# Patient Record
Sex: Female | Born: 1935 | Race: White | Hispanic: No | Smoking: Former smoker
Health system: Southern US, Community
[De-identification: ages and names within clinical notes are randomized; demographics above are authoritative.]

## PROBLEM LIST (undated history)

## (undated) DIAGNOSIS — I2510 Atherosclerotic heart disease of native coronary artery without angina pectoris: Secondary | ICD-10-CM

## (undated) DIAGNOSIS — I10 Essential (primary) hypertension: Secondary | ICD-10-CM

## (undated) DIAGNOSIS — J45909 Unspecified asthma, uncomplicated: Secondary | ICD-10-CM

## (undated) HISTORY — PX: OTHER SURGICAL HISTORY: SHX169

## (undated) HISTORY — PX: PACEMAKER INSERTION: SHX728

## (undated) HISTORY — PX: CORONARY ANGIOPLASTY WITH STENT PLACEMENT: SHX49

---

## 2003-09-23 ENCOUNTER — Encounter: Admission: RE | Admit: 2003-09-23 | Discharge: 2003-09-23 | Payer: Self-pay | Admitting: Orthopedic Surgery

## 2004-04-23 ENCOUNTER — Ambulatory Visit: Payer: Self-pay | Admitting: Ophthalmology

## 2004-04-27 ENCOUNTER — Ambulatory Visit: Payer: Self-pay | Admitting: Ophthalmology

## 2004-06-08 ENCOUNTER — Ambulatory Visit: Payer: Self-pay | Admitting: Ophthalmology

## 2004-06-15 ENCOUNTER — Ambulatory Visit: Payer: Self-pay | Admitting: Ophthalmology

## 2004-07-15 ENCOUNTER — Ambulatory Visit: Payer: Self-pay | Admitting: Gastroenterology

## 2005-07-04 ENCOUNTER — Ambulatory Visit: Payer: Self-pay | Admitting: Family Medicine

## 2005-08-10 ENCOUNTER — Ambulatory Visit: Payer: Self-pay | Admitting: Family Medicine

## 2006-07-05 ENCOUNTER — Ambulatory Visit: Payer: Self-pay | Admitting: Family Medicine

## 2006-12-18 ENCOUNTER — Ambulatory Visit: Payer: Self-pay | Admitting: Gastroenterology

## 2006-12-29 ENCOUNTER — Ambulatory Visit: Payer: Self-pay | Admitting: Gastroenterology

## 2007-07-16 ENCOUNTER — Ambulatory Visit: Payer: Self-pay | Admitting: Family Medicine

## 2007-09-19 ENCOUNTER — Other Ambulatory Visit: Payer: Self-pay

## 2007-09-19 ENCOUNTER — Emergency Department: Payer: Self-pay | Admitting: Emergency Medicine

## 2008-08-25 ENCOUNTER — Ambulatory Visit: Payer: Self-pay | Admitting: Urology

## 2008-12-15 ENCOUNTER — Ambulatory Visit: Payer: Self-pay | Admitting: General Practice

## 2008-12-24 ENCOUNTER — Ambulatory Visit: Payer: Self-pay | Admitting: General Practice

## 2008-12-31 ENCOUNTER — Inpatient Hospital Stay: Payer: Self-pay | Admitting: General Practice

## 2009-06-28 ENCOUNTER — Emergency Department: Payer: Self-pay | Admitting: Emergency Medicine

## 2009-11-26 ENCOUNTER — Ambulatory Visit: Payer: Self-pay | Admitting: Vascular Surgery

## 2010-11-01 ENCOUNTER — Ambulatory Visit: Payer: Self-pay | Admitting: Vascular Surgery

## 2011-03-23 ENCOUNTER — Ambulatory Visit: Payer: Self-pay | Admitting: Vascular Surgery

## 2011-04-12 ENCOUNTER — Ambulatory Visit: Payer: Self-pay | Admitting: General Practice

## 2011-04-25 ENCOUNTER — Inpatient Hospital Stay: Payer: Self-pay | Admitting: General Practice

## 2011-04-30 ENCOUNTER — Encounter: Payer: Self-pay | Admitting: Internal Medicine

## 2011-05-06 ENCOUNTER — Ambulatory Visit: Payer: Self-pay | Admitting: Internal Medicine

## 2011-05-19 ENCOUNTER — Encounter: Payer: Self-pay | Admitting: Internal Medicine

## 2011-06-18 ENCOUNTER — Encounter: Payer: Self-pay | Admitting: Internal Medicine

## 2011-07-19 LAB — CK TOTAL AND CKMB (NOT AT ARMC)
CK, Total: 149 U/L (ref 21–215)
CK-MB: 4.8 ng/mL — ABNORMAL HIGH (ref 0.5–3.6)

## 2011-07-19 LAB — COMPREHENSIVE METABOLIC PANEL
Alkaline Phosphatase: 108 U/L (ref 50–136)
BUN: 31 mg/dL — ABNORMAL HIGH (ref 7–18)
Co2: 23 mmol/L (ref 21–32)
Creatinine: 1.25 mg/dL (ref 0.60–1.30)
EGFR (African American): 53 — ABNORMAL LOW
EGFR (Non-African Amer.): 44 — ABNORMAL LOW
SGOT(AST): 34 U/L (ref 15–37)
SGPT (ALT): 24 U/L
Sodium: 137 mmol/L (ref 136–145)

## 2011-07-19 LAB — CBC
HCT: 38.8 % (ref 35.0–47.0)
MCHC: 33.3 g/dL (ref 32.0–36.0)
MCV: 89 fL (ref 80–100)
RDW: 14.9 % — ABNORMAL HIGH (ref 11.5–14.5)

## 2011-07-20 ENCOUNTER — Inpatient Hospital Stay: Payer: Self-pay | Admitting: Internal Medicine

## 2011-07-20 LAB — CK TOTAL AND CKMB (NOT AT ARMC)
CK, Total: 141 U/L (ref 21–215)
CK-MB: 4.4 ng/mL — ABNORMAL HIGH (ref 0.5–3.6)

## 2011-07-22 ENCOUNTER — Encounter: Payer: Self-pay | Admitting: Internal Medicine

## 2011-08-19 ENCOUNTER — Encounter: Payer: Self-pay | Admitting: Internal Medicine

## 2011-12-06 LAB — CBC
HCT: 35 % (ref 35.0–47.0)
MCHC: 33.5 g/dL (ref 32.0–36.0)
MCV: 93 fL (ref 80–100)
RBC: 3.75 10*6/uL — ABNORMAL LOW (ref 3.80–5.20)

## 2011-12-06 LAB — COMPREHENSIVE METABOLIC PANEL
Anion Gap: 9 (ref 7–16)
Bilirubin,Total: 0.2 mg/dL (ref 0.2–1.0)
Calcium, Total: 8.6 mg/dL (ref 8.5–10.1)
Co2: 24 mmol/L (ref 21–32)
Creatinine: 1.93 mg/dL — ABNORMAL HIGH (ref 0.60–1.30)
Glucose: 119 mg/dL — ABNORMAL HIGH (ref 65–99)
Potassium: 4.8 mmol/L (ref 3.5–5.1)
SGPT (ALT): 25 U/L

## 2011-12-07 ENCOUNTER — Inpatient Hospital Stay: Payer: Self-pay | Admitting: Specialist

## 2011-12-07 LAB — URINALYSIS, COMPLETE
Bacteria: NONE SEEN
Glucose,UR: NEGATIVE mg/dL (ref 0–75)
Ketone: NEGATIVE
Leukocyte Esterase: NEGATIVE
Nitrite: NEGATIVE
Ph: 5 (ref 4.5–8.0)
Protein: 30
RBC,UR: <1 /HPF (ref 0–5)
Squamous Epithelial: <1

## 2011-12-07 LAB — CK TOTAL AND CKMB (NOT AT ARMC)
CK, Total: 98 U/L (ref 21–215)
CK-MB: 3 ng/mL (ref 0.5–3.6)

## 2011-12-07 LAB — TROPONIN I: Troponin-I: 0.29 ng/mL — ABNORMAL HIGH

## 2011-12-08 LAB — BASIC METABOLIC PANEL
Anion Gap: 13 (ref 7–16)
BUN: 28 mg/dL — ABNORMAL HIGH (ref 7–18)
Calcium, Total: 9.2 mg/dL (ref 8.5–10.1)
Creatinine: 1.66 mg/dL — ABNORMAL HIGH (ref 0.60–1.30)
EGFR (Non-African Amer.): 28 — ABNORMAL LOW
Glucose: 163 mg/dL — ABNORMAL HIGH (ref 65–99)
Potassium: 4.4 mmol/L (ref 3.5–5.1)
Sodium: 139 mmol/L (ref 136–145)

## 2011-12-10 LAB — BASIC METABOLIC PANEL
Anion Gap: 11 (ref 7–16)
Chloride: 99 mmol/L (ref 98–107)
Co2: 26 mmol/L (ref 21–32)
Creatinine: 2.16 mg/dL — ABNORMAL HIGH (ref 0.60–1.30)
EGFR (Non-African Amer.): 20 — ABNORMAL LOW
Potassium: 4.3 mmol/L (ref 3.5–5.1)

## 2011-12-10 LAB — PLATELET COUNT: Platelet: 215 10*3/uL (ref 150–440)

## 2011-12-10 LAB — HEMOGLOBIN: HGB: 10 g/dL — ABNORMAL LOW (ref 12.0–16.0)

## 2011-12-11 LAB — BASIC METABOLIC PANEL
Anion Gap: 11 (ref 7–16)
BUN: 41 mg/dL — ABNORMAL HIGH (ref 7–18)
Calcium, Total: 8.4 mg/dL — ABNORMAL LOW (ref 8.5–10.1)
Creatinine: 1.68 mg/dL — ABNORMAL HIGH (ref 0.60–1.30)
EGFR (African American): 32 — ABNORMAL LOW
Glucose: 112 mg/dL — ABNORMAL HIGH (ref 65–99)
Osmolality: 283 (ref 275–301)

## 2012-03-22 ENCOUNTER — Ambulatory Visit: Payer: Self-pay | Admitting: Vascular Surgery

## 2012-03-22 LAB — CREATININE, SERUM: EGFR (Non-African Amer.): 25 — ABNORMAL LOW

## 2012-03-22 LAB — BUN: BUN: 35 mg/dL — ABNORMAL HIGH (ref 7–18)

## 2012-05-26 ENCOUNTER — Inpatient Hospital Stay: Payer: Self-pay | Admitting: Student

## 2012-05-26 LAB — TROPONIN I
Troponin-I: 0.13 ng/mL — ABNORMAL HIGH
Troponin-I: 0.42 ng/mL — ABNORMAL HIGH

## 2012-05-26 LAB — COMPREHENSIVE METABOLIC PANEL
Anion Gap: 11 (ref 7–16)
Calcium, Total: 9.3 mg/dL (ref 8.5–10.1)
Chloride: 106 mmol/L (ref 98–107)
Co2: 20 mmol/L — ABNORMAL LOW (ref 21–32)
EGFR (African American): 50 — ABNORMAL LOW
EGFR (Non-African Amer.): 43 — ABNORMAL LOW
SGOT(AST): 40 U/L — ABNORMAL HIGH (ref 15–37)
SGPT (ALT): 24 U/L (ref 12–78)

## 2012-05-26 LAB — URINALYSIS, COMPLETE
Bacteria: NONE SEEN
Glucose,UR: NEGATIVE mg/dL (ref 0–75)
Leukocyte Esterase: NEGATIVE
Nitrite: NEGATIVE
Protein: 100
WBC UR: 3 /HPF (ref 0–5)

## 2012-05-26 LAB — CBC
HGB: 12.6 g/dL (ref 12.0–16.0)
Platelet: 236 10*3/uL (ref 150–440)
RBC: 4.08 10*6/uL (ref 3.80–5.20)
RDW: 13.2 % (ref 11.5–14.5)

## 2012-05-26 LAB — BASIC METABOLIC PANEL
Anion Gap: 4 — ABNORMAL LOW (ref 7–16)
Calcium, Total: 9.2 mg/dL (ref 8.5–10.1)
Chloride: 108 mmol/L — ABNORMAL HIGH (ref 98–107)
Co2: 27 mmol/L (ref 21–32)
Creatinine: 1.31 mg/dL — ABNORMAL HIGH (ref 0.60–1.30)
EGFR (African American): 43 — ABNORMAL LOW

## 2012-05-26 LAB — CK TOTAL AND CKMB (NOT AT ARMC): CK-MB: 3.6 ng/mL (ref 0.5–3.6)

## 2012-05-27 LAB — BASIC METABOLIC PANEL
Anion Gap: 8 (ref 7–16)
Calcium, Total: 9.3 mg/dL (ref 8.5–10.1)
Co2: 25 mmol/L (ref 21–32)
Creatinine: 1.38 mg/dL — ABNORMAL HIGH (ref 0.60–1.30)
EGFR (African American): 41 — ABNORMAL LOW
EGFR (Non-African Amer.): 35 — ABNORMAL LOW
Glucose: 114 mg/dL — ABNORMAL HIGH (ref 65–99)
Sodium: 140 mmol/L (ref 136–145)

## 2012-05-27 LAB — LIPID PANEL: VLDL Cholesterol, Calc: 14 mg/dL (ref 5–40)

## 2012-05-27 LAB — TROPONIN I: Troponin-I: 0.74 ng/mL — ABNORMAL HIGH

## 2012-05-27 LAB — CBC WITH DIFFERENTIAL/PLATELET
Basophil #: 0.1 10*3/uL (ref 0.0–0.1)
Eosinophil #: 0.3 10*3/uL (ref 0.0–0.7)
HCT: 37.4 % (ref 35.0–47.0)
Lymphocyte %: 17.3 %
MCHC: 33.5 g/dL (ref 32.0–36.0)
Neutrophil #: 7 10*3/uL — ABNORMAL HIGH (ref 1.4–6.5)
RDW: 12.9 % (ref 11.5–14.5)

## 2012-05-27 LAB — CK TOTAL AND CKMB (NOT AT ARMC): CK-MB: 5.1 ng/mL — ABNORMAL HIGH (ref 0.5–3.6)

## 2012-05-28 LAB — BASIC METABOLIC PANEL
BUN: 17 mg/dL (ref 7–18)
Chloride: 100 mmol/L (ref 98–107)
Osmolality: 274 (ref 275–301)
Potassium: 3.6 mmol/L (ref 3.5–5.1)

## 2012-05-28 LAB — CBC WITH DIFFERENTIAL/PLATELET
Basophil #: 0.1 10*3/uL (ref 0.0–0.1)
Eosinophil #: 0 10*3/uL (ref 0.0–0.7)
HGB: 12.7 g/dL (ref 12.0–16.0)
MCH: 31.2 pg (ref 26.0–34.0)
MCHC: 34.2 g/dL (ref 32.0–36.0)
Neutrophil #: 12 10*3/uL — ABNORMAL HIGH (ref 1.4–6.5)
Neutrophil %: 84.3 %

## 2012-05-29 LAB — CBC WITH DIFFERENTIAL/PLATELET
Basophil #: 0.1 10*3/uL (ref 0.0–0.1)
Eosinophil %: 1.9 %
HCT: 31.4 % — ABNORMAL LOW (ref 35.0–47.0)
HGB: 11 g/dL — ABNORMAL LOW (ref 12.0–16.0)
Lymphocyte %: 13.5 %
Monocyte %: 10.6 %
Neutrophil #: 7.4 10*3/uL — ABNORMAL HIGH (ref 1.4–6.5)
RBC: 3.41 10*6/uL — ABNORMAL LOW (ref 3.80–5.20)
WBC: 10 10*3/uL (ref 3.6–11.0)

## 2012-05-29 LAB — BASIC METABOLIC PANEL
Creatinine: 1.92 mg/dL — ABNORMAL HIGH (ref 0.60–1.30)
EGFR (African American): 27 — ABNORMAL LOW
EGFR (Non-African Amer.): 23 — ABNORMAL LOW
Glucose: 86 mg/dL (ref 65–99)
Potassium: 3.7 mmol/L (ref 3.5–5.1)
Sodium: 135 mmol/L — ABNORMAL LOW (ref 136–145)

## 2012-05-30 LAB — BASIC METABOLIC PANEL
Anion Gap: 6 — ABNORMAL LOW (ref 7–16)
BUN: 21 mg/dL — ABNORMAL HIGH (ref 7–18)
Creatinine: 1.3 mg/dL (ref 0.60–1.30)
EGFR (Non-African Amer.): 38 — ABNORMAL LOW

## 2012-05-30 LAB — PROTEIN / CREATININE RATIO, URINE
Protein, Random Urine: 68 mg/dL — ABNORMAL HIGH (ref 0–12)
Protein/Creat. Ratio: 1444 mg/gCREAT — ABNORMAL HIGH (ref 0–200)

## 2012-06-06 ENCOUNTER — Emergency Department: Payer: Self-pay | Admitting: Emergency Medicine

## 2012-06-06 LAB — COMPREHENSIVE METABOLIC PANEL
Albumin: 3.5 g/dL (ref 3.4–5.0)
Alkaline Phosphatase: 163 U/L — ABNORMAL HIGH (ref 50–136)
Calcium, Total: 9.3 mg/dL (ref 8.5–10.1)
Co2: 25 mmol/L (ref 21–32)
EGFR (Non-African Amer.): 48 — ABNORMAL LOW
Glucose: 115 mg/dL — ABNORMAL HIGH (ref 65–99)
SGOT(AST): 31 U/L (ref 15–37)

## 2012-06-06 LAB — URINALYSIS, COMPLETE
Bacteria: NONE SEEN
Bilirubin,UR: NEGATIVE
Glucose,UR: NEGATIVE mg/dL (ref 0–75)
Ketone: NEGATIVE
RBC,UR: <1 /HPF (ref 0–5)
WBC UR: 5 /HPF (ref 0–5)

## 2012-06-06 LAB — CBC
MCH: 31.1 pg (ref 26.0–34.0)
MCV: 92 fL (ref 80–100)
Platelet: 268 10*3/uL (ref 150–440)
RDW: 13 % (ref 11.5–14.5)

## 2012-06-06 LAB — TROPONIN I: Troponin-I: 0.33 ng/mL — ABNORMAL HIGH

## 2012-06-19 ENCOUNTER — Ambulatory Visit: Payer: Self-pay | Admitting: Internal Medicine

## 2012-07-04 ENCOUNTER — Emergency Department: Payer: Self-pay

## 2012-07-04 ENCOUNTER — Ambulatory Visit: Payer: Self-pay | Admitting: Family Medicine

## 2012-07-18 ENCOUNTER — Ambulatory Visit: Payer: Self-pay | Admitting: Internal Medicine

## 2012-08-05 ENCOUNTER — Inpatient Hospital Stay: Payer: Self-pay | Admitting: Internal Medicine

## 2012-08-05 LAB — CBC
HCT: 38.1 % (ref 35.0–47.0)
HGB: 12.5 g/dL (ref 12.0–16.0)
Platelet: 220 10*3/uL (ref 150–440)
RBC: 4.22 10*6/uL (ref 3.80–5.20)
RDW: 13.4 % (ref 11.5–14.5)

## 2012-08-05 LAB — COMPREHENSIVE METABOLIC PANEL
BUN: 18 mg/dL (ref 7–18)
Calcium, Total: 9.3 mg/dL (ref 8.5–10.1)
Co2: 20 mmol/L — ABNORMAL LOW (ref 21–32)
EGFR (African American): >60
EGFR (Non-African Amer.): >60
Glucose: 101 mg/dL — ABNORMAL HIGH (ref 65–99)
Osmolality: 278 (ref 275–301)
Potassium: 5.9 mmol/L — ABNORMAL HIGH (ref 3.5–5.1)
SGPT (ALT): 27 U/L (ref 12–78)
Total Protein: 7.9 g/dL (ref 6.4–8.2)

## 2012-08-05 LAB — URINALYSIS, COMPLETE
Bacteria: NONE SEEN
Ketone: NEGATIVE
Leukocyte Esterase: NEGATIVE
Nitrite: NEGATIVE
Ph: 6 (ref 4.5–8.0)
WBC UR: 3 /HPF (ref 0–5)

## 2012-08-06 LAB — BASIC METABOLIC PANEL
Anion Gap: 9 (ref 7–16)
Calcium, Total: 9.3 mg/dL (ref 8.5–10.1)
Co2: 28 mmol/L (ref 21–32)
Creatinine: 1.3 mg/dL (ref 0.60–1.30)
EGFR (African American): 43 — ABNORMAL LOW
Potassium: 3.7 mmol/L (ref 3.5–5.1)
Sodium: 141 mmol/L (ref 136–145)

## 2012-08-06 LAB — TROPONIN I: Troponin-I: 0.23 ng/mL — ABNORMAL HIGH

## 2012-09-03 ENCOUNTER — Emergency Department: Payer: Self-pay | Admitting: Unknown Physician Specialty

## 2012-10-08 ENCOUNTER — Ambulatory Visit: Payer: Self-pay | Admitting: Cardiology

## 2012-10-08 LAB — CBC WITH DIFFERENTIAL/PLATELET
Basophil #: 0.1 10*3/uL (ref 0.0–0.1)
Basophil %: 1.2 %
Eosinophil %: 1.4 %
HCT: 32.4 % — ABNORMAL LOW (ref 35.0–47.0)
HGB: 10.7 g/dL — ABNORMAL LOW (ref 12.0–16.0)
Lymphocyte %: 13.3 %
MCH: 29 pg (ref 26.0–34.0)
MCV: 88 fL (ref 80–100)
Monocyte #: 0.9 x10 3/mm (ref 0.2–0.9)
Monocyte %: 10.4 %
Neutrophil %: 73.7 %
Platelet: 318 10*3/uL (ref 150–440)
RDW: 15.1 % — ABNORMAL HIGH (ref 11.5–14.5)
WBC: 8.2 10*3/uL (ref 3.6–11.0)

## 2012-10-08 LAB — BASIC METABOLIC PANEL
Anion Gap: 3 — ABNORMAL LOW (ref 7–16)
BUN: 24 mg/dL — ABNORMAL HIGH (ref 7–18)
Calcium, Total: 8.4 mg/dL — ABNORMAL LOW (ref 8.5–10.1)
Co2: 28 mmol/L (ref 21–32)
Creatinine: 1.44 mg/dL — ABNORMAL HIGH (ref 0.60–1.30)
EGFR (African American): 38 — ABNORMAL LOW
EGFR (Non-African Amer.): 33 — ABNORMAL LOW
Osmolality: 280 (ref 275–301)
Potassium: 4.3 mmol/L (ref 3.5–5.1)

## 2012-10-10 ENCOUNTER — Ambulatory Visit: Payer: Self-pay | Admitting: Cardiology

## 2012-12-17 ENCOUNTER — Ambulatory Visit: Payer: Self-pay | Admitting: Internal Medicine

## 2013-07-05 ENCOUNTER — Ambulatory Visit: Payer: Self-pay | Admitting: Family Medicine

## 2013-11-30 IMAGING — CT CT HEAD WITHOUT CONTRAST
2 series · 16 of 30 positions shown, 20 images · non-contrast
Comparison: none

REASON FOR EXAM: ha htn
COMMENTS:

PROCEDURE:     CT  - CT HEAD WITHOUT CONTRAST  - May 26, 2012 [DATE]
RESULT:     Comparison:  None
TECHNIQUE: Multiple axial images from the foramen magnum to the vertex were
obtained without IV contrast.

[Series 2: without · axial · non-contrast · 0.42mm/px · z∈[-169,-49]mm · 13 of 30 slices shown, 17 images]
[im 3/30  brain]
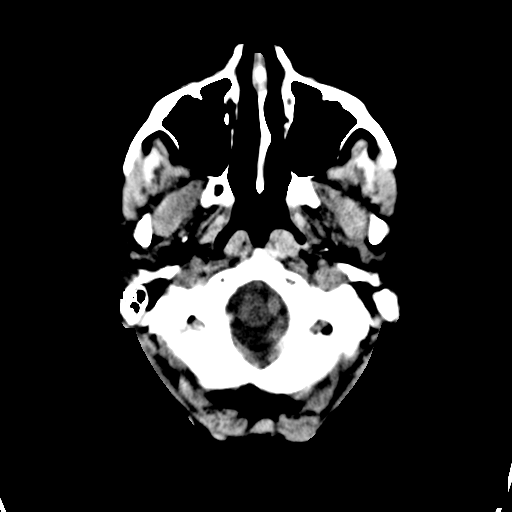
[im 3/30  bone]
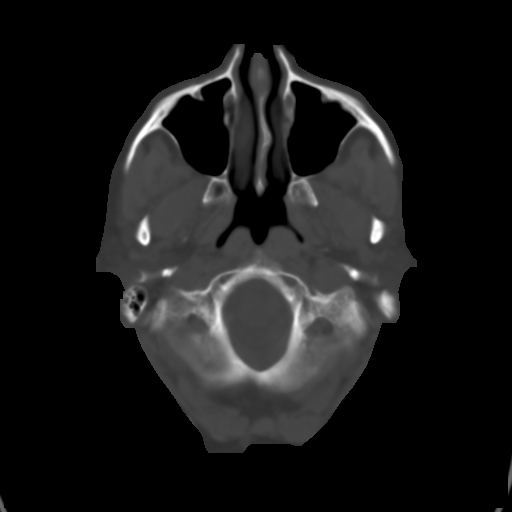
[im 5/30  brain]
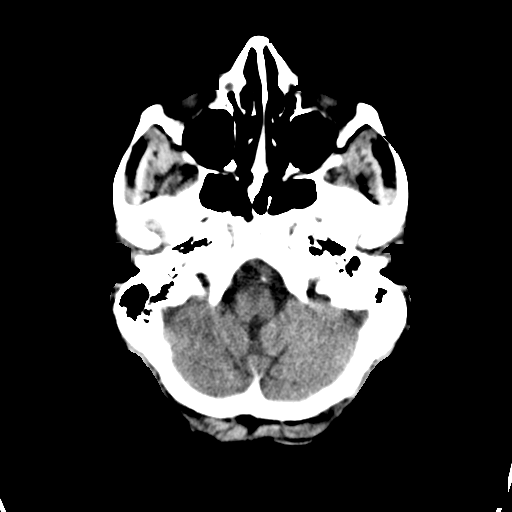
[im 7/30  brain]
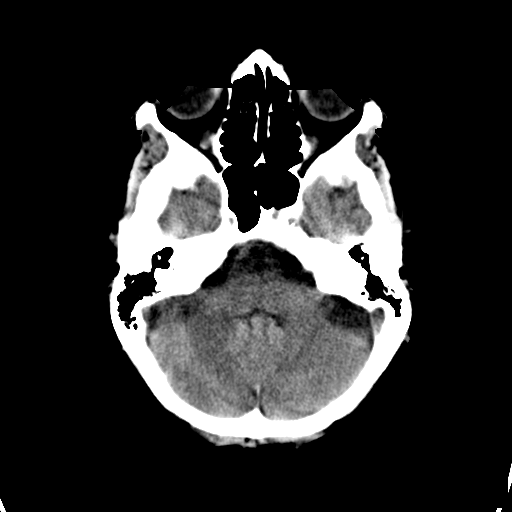
[im 9/30  brain]
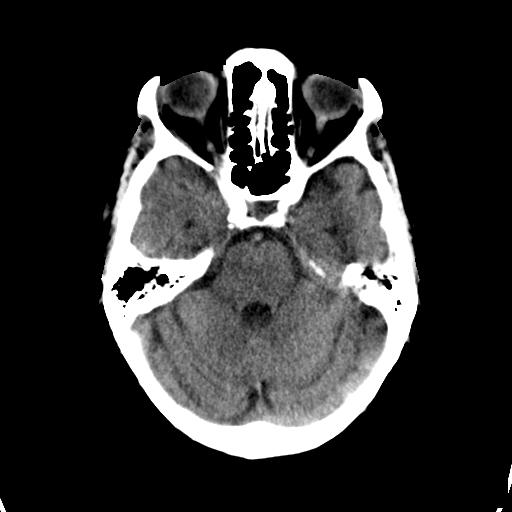
[im 11/30  brain]
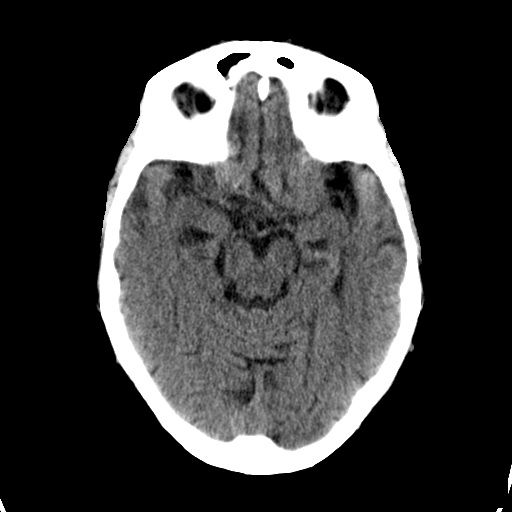
[im 11/30  bone]
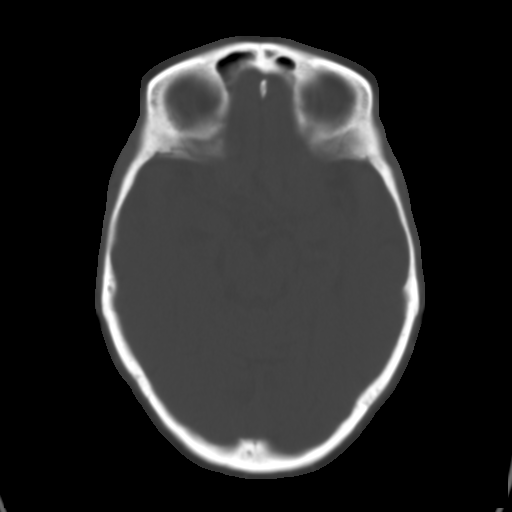
[im 13/30  brain]
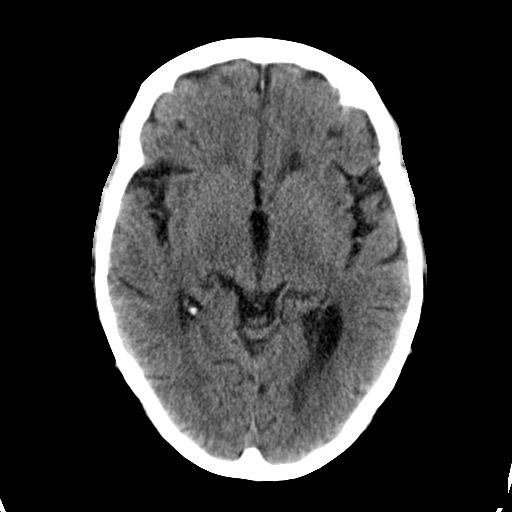
[im 15/30  brain]
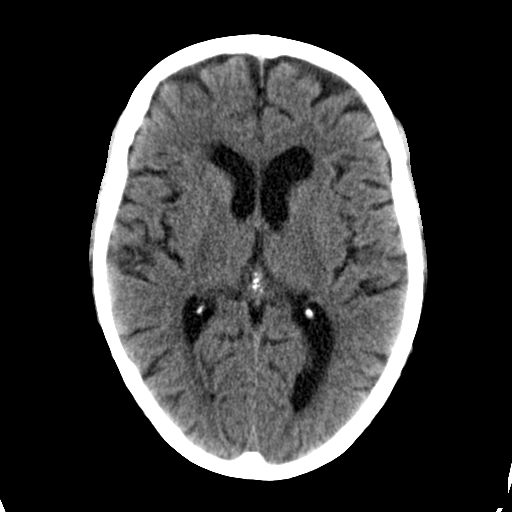
[im 17/30  brain]
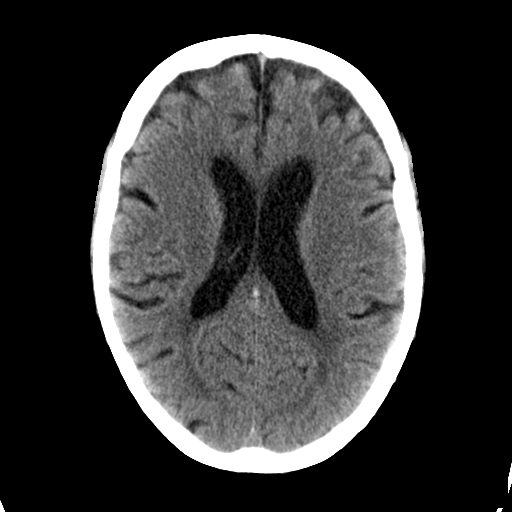
[im 19/30  brain]
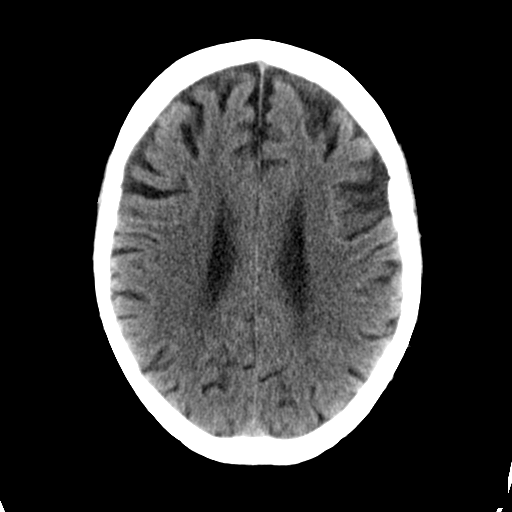
[im 19/30  bone]
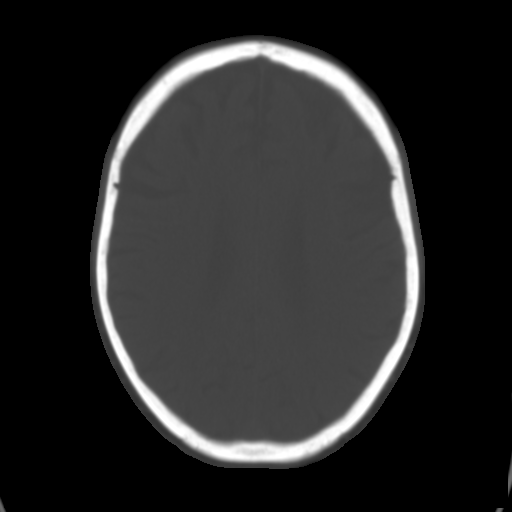
[im 21/30  brain]
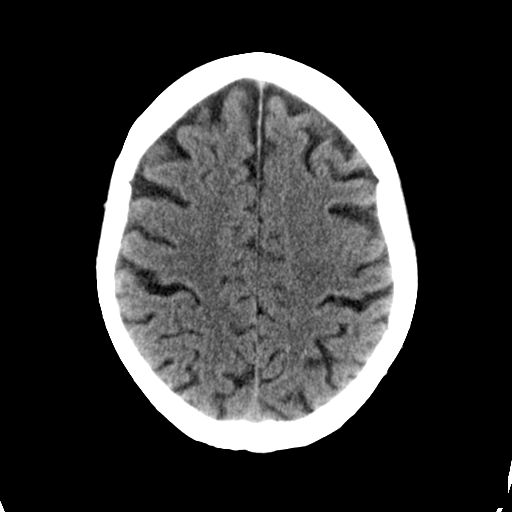
[im 23/30  brain]
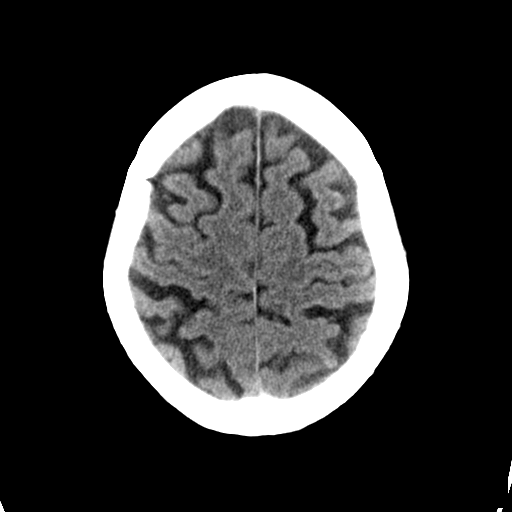
[im 25/30  brain]
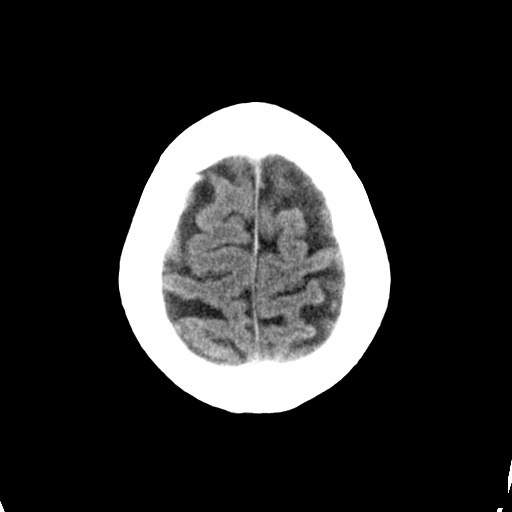
[im 27/30  brain]
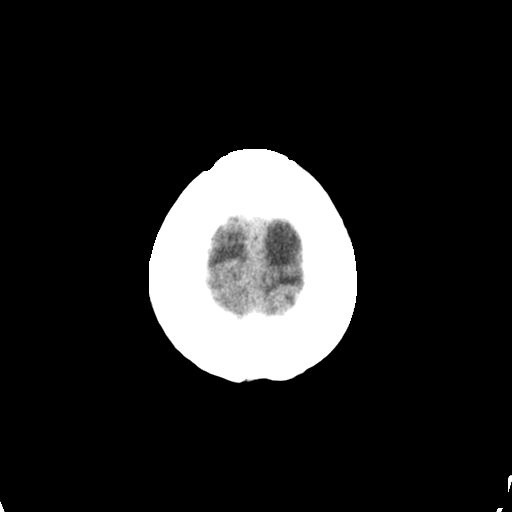
[im 27/30  bone]
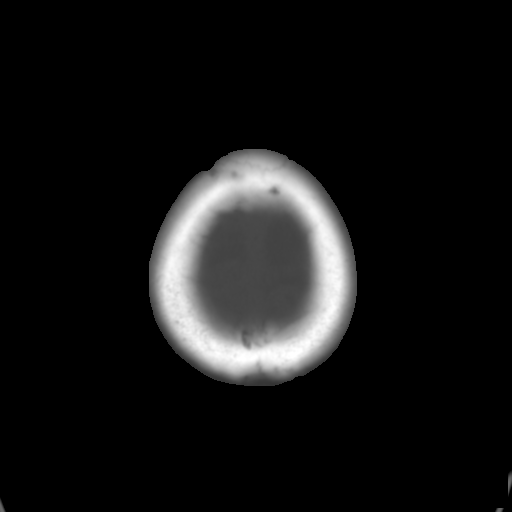

[Series 3: bone · axial · 0.42mm/px · z∈[-169,-129]mm · 3 of 30 slices shown]
[im 3/30  bone]
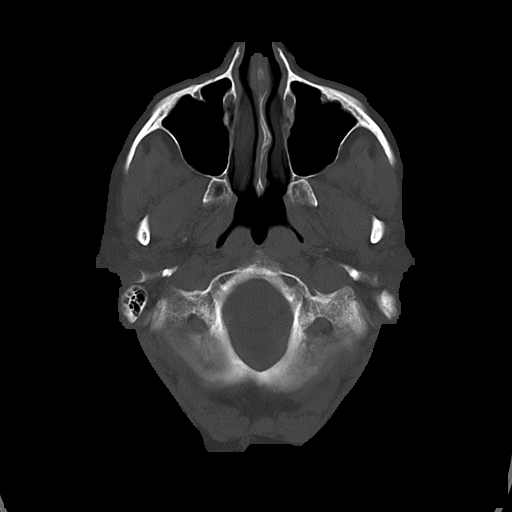
[im 7/30  bone]
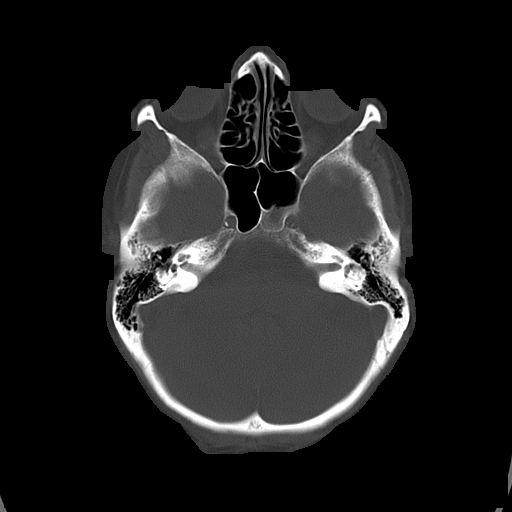
[im 11/30  bone]
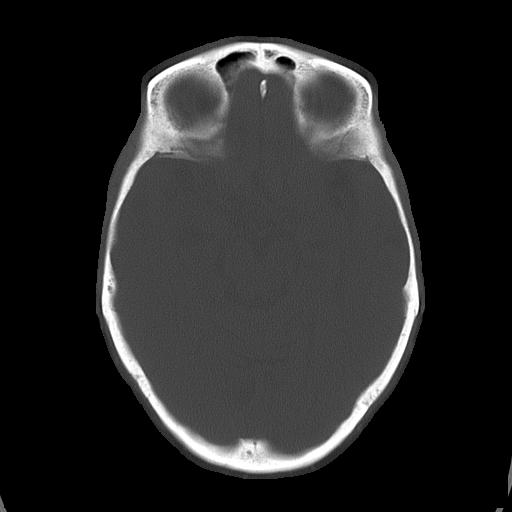

[16 of 30 positions shown; findings below may reference images not displayed]

FINDINGS: There is no evidence of mass effect, midline shift, or extra-axial fluid
collections.  There is no evidence of a space-occupying lesion or
intracranial hemorrhage. There is no evidence of a cortical-based area of
acute infarction.

The ventricles and sulci are appropriate for the patient's age. The basal
cisterns are patent.

Visualized portions of the orbits are unremarkable. The visualized portions
of the paranasal sinuses and mastoid air cells are unremarkable.

The osseous structures are unremarkable.
IMPRESSION: No acute intracranial process.

[REDACTED]

## 2014-02-10 ENCOUNTER — Ambulatory Visit: Payer: Self-pay | Admitting: Gastroenterology

## 2014-02-12 LAB — PATHOLOGY REPORT

## 2014-02-26 LAB — URINALYSIS, COMPLETE
BILIRUBIN, UR: NEGATIVE
Blood: NEGATIVE
Glucose,UR: NEGATIVE mg/dL (ref 0–75)
Ketone: NEGATIVE
NITRITE: NEGATIVE
PH: 6 (ref 4.5–8.0)
Protein: 30
RBC,UR: 41 /HPF (ref 0–5)
Specific Gravity: 1.012 (ref 1.003–1.030)
WBC UR: 1000 /HPF (ref 0–5)

## 2014-02-26 LAB — COMPREHENSIVE METABOLIC PANEL
ALK PHOS: 161 U/L — AB
Albumin: 3.3 g/dL — ABNORMAL LOW (ref 3.4–5.0)
Anion Gap: 18 — ABNORMAL HIGH (ref 7–16)
BUN: 26 mg/dL — ABNORMAL HIGH (ref 7–18)
Bilirubin,Total: 0.8 mg/dL (ref 0.2–1.0)
CHLORIDE: 99 mmol/L (ref 98–107)
Calcium, Total: 9.2 mg/dL (ref 8.5–10.1)
Co2: 17 mmol/L — ABNORMAL LOW (ref 21–32)
Creatinine: 2.38 mg/dL — ABNORMAL HIGH (ref 0.60–1.30)
EGFR (African American): 21 — ABNORMAL LOW
EGFR (Non-African Amer.): 18 — ABNORMAL LOW
GLUCOSE: 149 mg/dL — AB (ref 65–99)
Osmolality: 276 (ref 275–301)
POTASSIUM: 3.7 mmol/L (ref 3.5–5.1)
SGOT(AST): 55 U/L — ABNORMAL HIGH (ref 15–37)
SGPT (ALT): 32 U/L
SODIUM: 134 mmol/L — AB (ref 136–145)
Total Protein: 8.1 g/dL (ref 6.4–8.2)

## 2014-02-26 LAB — CBC
HCT: 48.5 % — ABNORMAL HIGH (ref 35.0–47.0)
HGB: 15.7 g/dL (ref 12.0–16.0)
MCH: 29.7 pg (ref 26.0–34.0)
MCHC: 32.4 g/dL (ref 32.0–36.0)
MCV: 92 fL (ref 80–100)
Platelet: 277 10*3/uL (ref 150–440)
RBC: 5.29 10*6/uL — ABNORMAL HIGH (ref 3.80–5.20)
RDW: 14.1 % (ref 11.5–14.5)
WBC: 39.6 10*3/uL — ABNORMAL HIGH (ref 3.6–11.0)

## 2014-02-26 LAB — TROPONIN I: Troponin-I: 0.07 ng/mL — ABNORMAL HIGH

## 2014-02-26 LAB — LIPASE, BLOOD: Lipase: 125 U/L (ref 73–393)

## 2014-02-27 ENCOUNTER — Inpatient Hospital Stay: Payer: Self-pay | Admitting: Internal Medicine

## 2014-02-27 LAB — BASIC METABOLIC PANEL
Anion Gap: 13 (ref 7–16)
BUN: 28 mg/dL — ABNORMAL HIGH (ref 7–18)
Calcium, Total: 8.3 mg/dL — ABNORMAL LOW (ref 8.5–10.1)
Chloride: 100 mmol/L (ref 98–107)
Co2: 22 mmol/L (ref 21–32)
Creatinine: 2.27 mg/dL — ABNORMAL HIGH (ref 0.60–1.30)
EGFR (African American): 22 — ABNORMAL LOW
EGFR (Non-African Amer.): 19 — ABNORMAL LOW
Glucose: 139 mg/dL — ABNORMAL HIGH (ref 65–99)
Osmolality: 278 (ref 275–301)
Potassium: 3.9 mmol/L (ref 3.5–5.1)
Sodium: 135 mmol/L — ABNORMAL LOW (ref 136–145)

## 2014-02-27 LAB — TSH: Thyroid Stimulating Horm: 1.23 u[IU]/mL

## 2014-02-27 LAB — TROPONIN I: TROPONIN-I: 0.06 ng/mL — AB

## 2014-02-28 LAB — URINE CULTURE

## 2014-02-28 LAB — BASIC METABOLIC PANEL
Anion Gap: 10 (ref 7–16)
BUN: 19 mg/dL — AB (ref 7–18)
CALCIUM: 7.8 mg/dL — AB (ref 8.5–10.1)
CHLORIDE: 108 mmol/L — AB (ref 98–107)
Co2: 23 mmol/L (ref 21–32)
Creatinine: 1.57 mg/dL — ABNORMAL HIGH (ref 0.60–1.30)
EGFR (African American): 34 — ABNORMAL LOW
EGFR (Non-African Amer.): 30 — ABNORMAL LOW
Glucose: 95 mg/dL (ref 65–99)
Osmolality: 283 (ref 275–301)
Potassium: 4 mmol/L (ref 3.5–5.1)
Sodium: 141 mmol/L (ref 136–145)

## 2014-02-28 LAB — CBC WITH DIFFERENTIAL/PLATELET
BASOS PCT: 0.5 %
Basophil #: 0.1 10*3/uL (ref 0.0–0.1)
EOS ABS: 0.1 10*3/uL (ref 0.0–0.7)
Eosinophil %: 0.4 %
HCT: 33.7 % — AB (ref 35.0–47.0)
HGB: 11.2 g/dL — ABNORMAL LOW (ref 12.0–16.0)
Lymphocyte #: 1.3 10*3/uL (ref 1.0–3.6)
Lymphocyte %: 8.6 %
MCH: 30.1 pg (ref 26.0–34.0)
MCHC: 33.1 g/dL (ref 32.0–36.0)
MCV: 91 fL (ref 80–100)
Monocyte #: 1 x10 3/mm — ABNORMAL HIGH (ref 0.2–0.9)
Monocyte %: 7 %
Neutrophil #: 12.5 10*3/uL — ABNORMAL HIGH (ref 1.4–6.5)
Neutrophil %: 83.5 %
PLATELETS: 170 10*3/uL (ref 150–440)
RBC: 3.71 10*6/uL — ABNORMAL LOW (ref 3.80–5.20)
RDW: 14.2 % (ref 11.5–14.5)
WBC: 14.9 10*3/uL — ABNORMAL HIGH (ref 3.6–11.0)

## 2014-03-01 LAB — CBC WITH DIFFERENTIAL/PLATELET
Basophil #: 0.1 10*3/uL (ref 0.0–0.1)
Basophil %: 0.7 %
EOS ABS: 0.2 10*3/uL (ref 0.0–0.7)
Eosinophil %: 2.3 %
HCT: 35.4 % (ref 35.0–47.0)
HGB: 11.8 g/dL — AB (ref 12.0–16.0)
LYMPHS ABS: 1.3 10*3/uL (ref 1.0–3.6)
Lymphocyte %: 12.4 %
MCH: 30.8 pg (ref 26.0–34.0)
MCHC: 33.2 g/dL (ref 32.0–36.0)
MCV: 93 fL (ref 80–100)
Monocyte #: 0.9 x10 3/mm (ref 0.2–0.9)
Monocyte %: 8.7 %
Neutrophil #: 8.1 10*3/uL — ABNORMAL HIGH (ref 1.4–6.5)
Neutrophil %: 75.9 %
PLATELETS: 168 10*3/uL (ref 150–440)
RBC: 3.82 10*6/uL (ref 3.80–5.20)
RDW: 14.2 % (ref 11.5–14.5)
WBC: 10.7 10*3/uL (ref 3.6–11.0)

## 2014-03-01 LAB — BASIC METABOLIC PANEL
Anion Gap: 10 (ref 7–16)
BUN: 9 mg/dL (ref 7–18)
CHLORIDE: 110 mmol/L — AB (ref 98–107)
Calcium, Total: 7.9 mg/dL — ABNORMAL LOW (ref 8.5–10.1)
Co2: 22 mmol/L (ref 21–32)
Creatinine: 1.25 mg/dL (ref 0.60–1.30)
EGFR (African American): 45 — ABNORMAL LOW
GFR CALC NON AF AMER: 39 — AB
Glucose: 106 mg/dL — ABNORMAL HIGH (ref 65–99)
Osmolality: 282 (ref 275–301)
POTASSIUM: 4.1 mmol/L (ref 3.5–5.1)
SODIUM: 142 mmol/L (ref 136–145)

## 2014-03-01 LAB — HEPATIC FUNCTION PANEL A (ARMC)
ALK PHOS: 94 U/L
Albumin: 2.3 g/dL — ABNORMAL LOW (ref 3.4–5.0)
Bilirubin, Direct: 0.1 mg/dL (ref 0.00–0.20)
Bilirubin,Total: 0.4 mg/dL (ref 0.2–1.0)
SGOT(AST): 43 U/L — ABNORMAL HIGH (ref 15–37)
SGPT (ALT): 22 U/L
TOTAL PROTEIN: 6 g/dL — AB (ref 6.4–8.2)

## 2014-03-04 LAB — CULTURE, BLOOD (SINGLE)

## 2014-05-02 ENCOUNTER — Ambulatory Visit: Payer: Self-pay | Admitting: Gastroenterology

## 2014-09-09 ENCOUNTER — Ambulatory Visit: Payer: Self-pay | Admitting: Family Medicine

## 2014-09-12 ENCOUNTER — Emergency Department: Payer: Self-pay | Admitting: Emergency Medicine

## 2014-09-19 ENCOUNTER — Inpatient Hospital Stay: Payer: Self-pay | Admitting: Internal Medicine

## 2014-10-05 ENCOUNTER — Emergency Department: Payer: Self-pay | Admitting: Emergency Medicine

## 2014-10-30 ENCOUNTER — Emergency Department
Admit: 2014-10-30 | Disposition: A | Discharge status: Home / Self Care | Payer: Self-pay | Admitting: Emergency Medicine

## 2014-10-30 LAB — CBC WITH DIFFERENTIAL/PLATELET
Basophil #: 0.1 10*3/uL (ref 0.0–0.1)
Basophil %: 1 %
EOS PCT: 0.4 %
Eosinophil #: 0.1 10*3/uL (ref 0.0–0.7)
HCT: 37.2 % (ref 35.0–47.0)
HGB: 12.2 g/dL (ref 12.0–16.0)
LYMPHS ABS: 1.1 10*3/uL (ref 1.0–3.6)
Lymphocyte %: 8.2 %
MCH: 29.7 pg (ref 26.0–34.0)
MCHC: 32.8 g/dL (ref 32.0–36.0)
MCV: 91 fL (ref 80–100)
Monocyte #: 0.6 x10 3/mm (ref 0.2–0.9)
Monocyte %: 4.9 %
Neutrophil #: 11.1 10*3/uL — ABNORMAL HIGH (ref 1.4–6.5)
Neutrophil %: 85.5 %
Platelet: 311 10*3/uL (ref 150–440)
RBC: 4.1 10*6/uL (ref 3.80–5.20)
RDW: 14.5 % (ref 11.5–14.5)
WBC: 13 10*3/uL — ABNORMAL HIGH (ref 3.6–11.0)

## 2014-10-30 LAB — COMPREHENSIVE METABOLIC PANEL
ALT: 20 U/L
AST: 31 U/L
Albumin: 4.1 g/dL
Alkaline Phosphatase: 89 U/L
Anion Gap: 10 (ref 7–16)
BILIRUBIN TOTAL: 0.4 mg/dL
BUN: 28 mg/dL — AB
CHLORIDE: 100 mmol/L — AB
Calcium, Total: 9.1 mg/dL
Co2: 26 mmol/L
Creatinine: 1.24 mg/dL — ABNORMAL HIGH
EGFR (African American): 45 — ABNORMAL LOW
GFR CALC NON AF AMER: 39 — AB
GLUCOSE: 110 mg/dL — AB
Potassium: 3.8 mmol/L
Sodium: 136 mmol/L
TOTAL PROTEIN: 8.1 g/dL

## 2014-11-04 ENCOUNTER — Emergency Department
Admit: 2014-11-04 | Disposition: A | Discharge status: Home / Self Care | Payer: Self-pay | Admitting: Emergency Medicine

## 2014-11-04 LAB — BASIC METABOLIC PANEL
ANION GAP: 7 (ref 7–16)
BUN: 20 mg/dL
CALCIUM: 8.8 mg/dL — AB
CHLORIDE: 100 mmol/L — AB
Co2: 28 mmol/L
Creatinine: 1.09 mg/dL — ABNORMAL HIGH
EGFR (African American): 53 — ABNORMAL LOW
EGFR (Non-African Amer.): 46 — ABNORMAL LOW
GLUCOSE: 128 mg/dL — AB
Potassium: 3.8 mmol/L
SODIUM: 135 mmol/L

## 2014-11-04 LAB — CBC
HCT: 36.4 % (ref 35.0–47.0)
HGB: 11.8 g/dL — ABNORMAL LOW (ref 12.0–16.0)
MCH: 29.7 pg (ref 26.0–34.0)
MCHC: 32.5 g/dL (ref 32.0–36.0)
MCV: 92 fL (ref 80–100)
Platelet: 280 10*3/uL (ref 150–440)
RBC: 3.97 10*6/uL (ref 3.80–5.20)
RDW: 14.6 % — AB (ref 11.5–14.5)
WBC: 8.5 10*3/uL (ref 3.6–11.0)

## 2014-11-04 NOTE — Op Note (Signed)
PATIENT NAME:  Sophia Bradley, FLORESTAL MR#:  [id number] DATE OF BIRTH:  [birth year]  DATE OF PROCEDURE:  03/22/2012  PREOPERATIVE DIAGNOSES:  1. Renal artery stenosis status post right renal artery stent placement with noninvasive studies suggesting recurrent stenosis.  2. Severe poorly controlled hypertension.  3. Chronic kidney disease with creatinine of 1.8 and creatinine clearance less than 30.   POSTOPERATIVE DIAGNOSES:  1. Renal artery stenosis status post right renal artery stent placement with noninvasive studies suggesting recurrent stenosis.  2. Severe poorly controlled hypertension.  3. Chronic kidney disease with creatinine of 1.8 and creatinine clearance less than 30.   PROCEDURES: 1. Ultrasound guidance for vascular access, right femoral artery.  2. Catheter placement into right renal artery from right femoral approach.  3. Aortogram and selective right renal arteriogram.  4. Percutaneous transluminal angioplasty of right renal artery with 5 mm diameter angioplasty balloon.  5. StarClose closure device, right femoral artery.   SURGEON: Annice Needy, MD  ANESTHESIA: Local with moderate conscious sedation.   ESTIMATED BLOOD LOSS: 25 mL.   INDICATION FOR PROCEDURE: This is an 81 year old white female with known history of renal artery stenosis. She has had worsening hypertension and worsening renal function and her right kidney had decreased in size. Recent noninvasive studies showed markedly elevated velocities within and just distal to the right renal artery stent worrisome for recurrent stenosis. She is brought back to angiography for further evaluation and possible treatment in hopes of improving hours hypertension and stabilizing her kidney dysfunction.   DESCRIPTION OF PROCEDURE: Patient is brought to the vascular interventional radiology suite. Groins shaved and prepped, sterile surgical field was created. Due to poor anatomic landmarks and multiple previous accesses the right  femoral artery was accessed under direct ultrasound guidance without difficulty with a Seldinger needle. J-wire and 5 French sheath were placed. Pigtail catheter was placed into the aorta at approximately the L1 level above the previously placed renal stent and AP aortogram was performed. This showed minimal stenosis of the left renal artery in the 20% range. The right renal artery stent had a significant stenosis somewhere in the 80% range. There was significant intimal hyperplasia throughout the stent. Patient was systemically heparinized. I used a C2 catheter to cannulate the right renal artery and crossed the lesion without difficulty with a V18 wire. A 5 mm diameter angioplasty balloon was then inflated encompassing the lesion with a tight waste taken which resolved with angioplasty. I then replaced the C2 catheter and performed completion angiography through this diagnostic catheter. This showed markedly improved flow through the renal artery stent with a good nephrogram and minimal residual stenosis of less than 20%. At this point, I elected to terminate the procedure. The sheath was removed. StarClose closure device deployed in the usual fashion with excellent hemostatic result. The patient tolerated procedure well and was taken to the recovery room in stable condition.    ____________________________ Annice Needy, MD jsd:cms D: 03/22/2012 18:11:26 ET T: 03/23/2012 09:48:33 ET JOB#: [id number]  cc: Annice Needy, MD, <Dictator> Rhona Leavens. Burnett Sheng, MD  Annice Needy MD ELECTRONICALLY SIGNED 03/25/2012 14:41

## 2014-11-04 NOTE — Consult Note (Signed)
General Aspect 81 yo female with history of hypertension and hyperlipidemia with history of renovascular disease s/p renal stent in 5/13. She was admitted after noting that her blood pressure was elevated when checking it as an outpatient. She denies any chest pain or shortness of breath. Her ekg is at its baseline. She was noted to have a serum troponin of 0.74. She states she is compliant with her meds. She also has CKD III with serum creatinine of 1.8. She takes clonidine 0.1-0.2 bid, lisinopril 20 mg daily, bystolic 20 mg daily. Since admission, she remains hypertensive and is some what anxious regarding this. EXR and head ct was normal   Physical Exam:   GEN well developed, well nourished, no acute distress    HEENT PERRL, hearing intact to voice    NECK supple    RESP normal resp effort  clear BS    CARD Regular rate and rhythm  No murmur    ABD denies tenderness  normal BS    LYMPH negative neck, negative axillae    EXTR negative cyanosis/clubbing, negative edema    SKIN normal to palpation    NEURO cranial nerves intact, motor/sensory function intact    PSYCH A+O to time, place, person   Review of Systems:   Subjective/Chief Complaint elevated blood pressure    General: No Complaints    Skin: No Complaints    ENT: No Complaints    Eyes: No Complaints    Neck: No Complaints    Respiratory: No Complaints    Cardiovascular: No Complaints    Gastrointestinal: Diarrhea    Genitourinary: No Complaints    Vascular: No Complaints    Musculoskeletal: No Complaints    Neurologic: No Complaints    Hematologic: No Complaints    Endocrine: No Complaints    Psychiatric: No Complaints    Review of Systems: All other systems were reviewed and found to be negative    Medications/Allergies Reviewed Medications/Allergies reviewed     Multi-drug Resistant Organism (MDRO): Positive culture for ESBL organsim., 06-May-2011   Clot in Left Leg:    GERD -  Esophageal Reflux:    Atrial Fibrillation:    Phlebitis of Left leg:    Hyperlipidemia:    PVD:    arthritis:    HTN:    Stent in Left Leg: 26-Nov-2009   D&C - Dilation and Curretage: 1961   Esophageal Dilation: 1995   Appendectomy:    Tonsillectomy:    right knee replacement:    left knee: 2006   bilateral cataracts: 2005   appendectomy: 1947   Carotid Endarterectomy Right Side:   Home Medications: Medication Instructions Status  furosemide tablet 20 mg 1 tab(s) orally once a day (in the morning) Active  PriLOSEC OTC 20 mg oral delayed release tablet 1 tab(s) orally once a day Active  trimethoprim 100 mg oral tablet 1 tab(s) orally once a day (at bedtime) Active  cyclobenzaprine 5 mg oral tablet 1 tab(s) orally once a day (at bedtime) Active  clonidine 0.2 mg oral tablet 2 tab(s) orally 2 times a day Active  citalopram 20 mg oral tablet 1 tab(s) orally once a day Active  Bystolic 10 mg oral tablet 1 tab(s) orally 2 times a day Active  acetaminophen 500 mg oral tablet 1 to 2 tab(s) orally every 4 hours as needed for pain or fever Active  aspirin 81 mg oral tablet 1 tab(s) orally once a day Active  lisinopril 40 mg oral tablet 1 tab(s)  orally once a day Active  Vitamin D2 50,000 intl units (1.25 mg) oral capsule 1 cap(s) orally once a week Active  pravastatin 20 mg oral tablet 1 tab(s) orally once a day (at bedtime) Active  Zyrtec 10 mg oral tablet 1  orally once a day, As Needed Active   EKG:   EKG NSR    Abnormal NSSTTW changes    Sulfa drugs: Rash    Impression 81 yo female with history of hypertension and peripheral vascular disease with stent in renal vessel now admitted with accelerated hypertension. No evidence of chf. Serum tropoinin is mild elevated with no chest pain or ekg changes. This is likely secondary to increased myocardial demand in face of hy8pertension and renal insuffiency. Does not appear to have had an acute ischemic cardiac event.  Would consider discontinuing iv heparin unless vascular surgery consultant feels this is needed. Would continue to treat hypertension with current drugs but will need to carefully follow renal function as creatinine is increased over baseline.    Plan 1. COnsider discontinuing heparin 2. Aggressivly treat hypertension. Will increase clonidine and follow pressure 3. Agree with eval per vascular surgery.  4. OK to transfer to telemetry.  5. Will follow with you.   Electronic Signatures: Dalia Heading (MD)  (Signed [phone] 08:52)  Authored: General Aspect/Present Illness, History and Physical Exam, Review of System, Past Medical History, Home Medications, EKG , Allergies, Impression/Plan   Last Updated: 10-Nov-13 08:52 by Dalia Heading (MD)

## 2014-11-04 NOTE — Op Note (Signed)
PATIENT NAME:  Sophia Bradley, Sophia Bradley MR#:  [id number] DATE OF BIRTH:  1925-12-05  DATE OF PROCEDURE:  05/28/2012  PREOPERATIVE DIAGNOSES:  1. Severe, poorly controlled hypertension.  2. Renal artery stenosis status post previous right renal artery stenting and angioplasty.  3. Atrial fibrillation.  4. Hyperlipidemia.  POSTOPERATIVE DIAGNOSES:  1. Severe, poorly controlled hypertension.  2. Renal artery stenosis status post previous right renal artery stenting and angioplasty.  3. Atrial fibrillation.  4. Hyperlipidemia.  PROCEDURE: 1. Ultrasound guidance for vascular access, right femoral artery.  2. Catheter placement into right renal artery from right femoral approach.  3. Aortogram and selective right renal angiogram.  4. Atrium covered stent placement, 6 mm diameter x 22 mm length into the right renal artery for recurrent stenosis.  5. StarClose closure device, right femoral artery.   SURGEON: Annice Needy, M.D.   ANESTHESIA: Local with moderate conscious sedation.   ESTIMATED BLOOD LOSS: 20-25 mL.   INDICATION FOR PROCEDURE: 81 year old white female who was readmitted with malignant hypertension, blood pressures well over 200 systolic. She has had right renal intervention previously. She is brought down for angiography for further evaluation and possible treatment. Risks and benefits were discussed. Informed consent was obtained.   DESCRIPTION OF PROCEDURE: The patient is brought to the vascular interventional radiology suite. Groins were shaved and prepped and a sterile surgical field was created. Due to multiple previous accesses, ultrasound was used to access the right femoral artery. This was done without difficulty with a Seldinger needle into a patent femoral artery and permanent image was recorded. J-wire and 5 French sheath were placed. Pigtail catheter was placed in the aorta just above the previously placed right renal stent at the L1 level and AP aortogram was performed. This  demonstrated what appeared to be diffuse intimal hyperplasia throughout the stent. There were not any high-grade stenoses, but the significant intimal hyperplasia did appear to be in the moderate range of restenosis. For this reason, I elected to treat it with a covered stent to try to reduce intimal hyperplasia formation in the future. I was able to use a C2 catheter and cross the lesion without difficulty with a Magic torque wire. I then exchanged for a 6 French Ansel sheath and brought this into the proximal portion of the stent. A 6-mm diameter x 22-mm length atrium covered stent was then selected. This encompassed the previously placed stent with intimal hyperplasia both proximally and distally and this was deployed reaching back just into the aorta. Completion angiogram following this demonstrated good flow through the atrium stent without significant flow limitation at this point and I elected to terminate the procedure. The sheath was pulled back and ipsilateral external artery oblique arteriogram was performed. A StarClose closure device was deployed in the usual fashion with excellent hemostatic result. The patient tolerated the procedure well and was taken to the recovery room in stable condition.   ____________________________ Annice Needy, MD jsd:bjt D: 05/28/2012 15:04:10 ET T: 05/28/2012 16:04:22 ET JOB#: [id number]  cc: Annice Needy, MD, <Dictator> Rhona Leavens. Burnett Sheng, MD Annice Needy MD ELECTRONICALLY SIGNED 06/04/2012 9:04

## 2014-11-04 NOTE — H&P (Signed)
PATIENT NAME:  Sophia Bradley, BEGNOCHE MR#:  [id number] DATE OF BIRTH:  01/06/1926  DATE OF ADMISSION:  05/27/2012  PRIMARY CARE PHYSICIAN: Jerl Mina, MD   VASCULAR SURGEON: Festus Barren, MD   CHIEF COMPLAINT: Uncontrolled blood pressure.   HISTORY OF PRESENT ILLNESS: Sophia Bradley is a very pleasant 81 year old Caucasian female with a past medical history of labile hypertension from renal artery stenosis who was seen by Dr. Wyn Quaker and had stent placement in the past followed by recurrent stenosis back in September 2013, peripheral vascular disease, hyperlipidemia, chronic urinary tract infection, and GERD who is presenting to the ER with a chief complaint of high blood pressure. The patient is reporting that following repair of the stenosed renal artery. Her blood pressure was reasonable and systolic blood pressure was running around 145 to 150. This afternoon when she checked her blood pressure it was really high and systolic was at 250 and diastolic was around 108. The patient started having headache but denies any chest pain. As the patient is concerned about her blood pressure, she came into the ER. She denies any blurry vision, black spots, or floaters. Denies any chest pain or shortness of breath. She has been following up with Dr. Wyn Quaker as an outpatient as well as Dr. Darrold Junker, cardiologist, regarding her labile hypertension. The patient's blood pressure in the ER initially was at 251/108. After giving clonidine it subsequently went down to 182/73 and during my examination blood pressure was at 167/74. CAT scan of the head was ordered which is pending. Chest x-ray has revealed no acute findings. The patient's creatinine is at 1.31 and, according to old medical records, her baseline creatinine is 1.44 which is her normal. Potassium is elevated at 5.7. One dose of Kayexalate was ordered by me in the ER. Cardiac enzymes were ordered. Initial troponin was 0.13 and subsequently in two hours it bumped up to 0.42.  The patient denies any chest pain or shortness of breath. Denies any past history of coronary artery disease. Denies any other complaints.   PAST MEDICAL HISTORY:  1. Labile hypertension from right renal artery stenosis status post stent placement with a noninvasive study suggesting recurrent stenosis in September 2013.  2. History of chronic renal insufficiency with baseline creatinine between 1.4 to 1.8.  3. Hyperlipidemia.  4. Peripheral vascular disease. 5. Arthritis. 6. Phlebitis of the left leg. 7. Gastroesophageal reflux disease.  PAST SURGICAL HISTORY:  1. Stent placement in the right renal artery for renal artery stenosis. 2. D and C in the past. 3. Esophageal dilatation in 1995. 4. Tonsillectomy. 5. Appendectomy. 6. Right knee replacement. 7. Left knee replacement in the year 2006. 8. Cataract repair.  9. Right carotid endarterectomy.   ALLERGIES: The patient is allergic to sulfa.   HOME MEDICATIONS:  1. Zyrtec 10 mg p.o. once daily. 2. Vitamin D 50,000 international units once a week. 3. Prilosec over-the-counter 20 mg p.o. once a day. 4. Trimethoprim 100 mg 1 tablet once a day. 5. Prevacid 20 mg p.o. once daily. 6. Lisinopril 40 mg once daily. 7. Furosemide 20 mg p.o. once a day. 8. Cyclobenzaprine 1 tablet p.o. once a day. 9. Clonidine 0.2 mg 2 tablets 2 times a day. 10. Celexa 1 tablet p.o. once daily. 11. Bystolic 10 mg 1 tablet p.o. b.i.d.  12. Aspirin 81 mg p.o. once daily. 13. Tylenol 500 mg 1 to 2 tablets p.o. q.4 hours as needed.   PSYCHOSOCIAL HISTORY: Lives at home with her daughter and son-in-law. Denies any  alcohol, smoking, or illicit drug usage.   FAMILY HISTORY: Both parents and siblings have chronic history of hypertension.   REVIEW OF SYSTEMS: CONSTITUTIONAL: Denies any fever, fatigue, weakness. EYES: Denies any double vision or blurry vision. ENT: Denies tinnitus, ear pain, hearing loss. RESPIRATORY: Denies cough, wheezing, hemoptysis.  CARDIOVASCULAR: The patient denies any chest pain or hypertension. GI: Denies nausea, vomiting, diarrhea, abdominal pain, or hematemesis. Positive GERD. GENITOURINARY: Denies any dysuria or hematuria. ENDOCRINE: No polyuria, nocturia, or polydipsia. Denies any thyroid problems. HEMATOLOGY/LYMPHATIC: Denies any anemia or easy bruising. No lymphadenopathy. SKIN: No acne, rash, or lesions. MUSCULOSKELETAL: Positive for arthritis. NEUROLOGIC: Complaining of headache but no history of TIA, CVA, numbness. PSYCHIATRIC: No anxiety, depression. All other systems are reviewed. Pertinent positives are as dictated above. All other systems are negative.   PHYSICAL EXAMINATION:  VITAL SIGNS: Temperature 97.5, pulse 55, respiratory rate 18, blood pressure during my examination 167/74, sating 97 to 100%.   GENERAL APPEARANCE: Not in acute distress. Answering questions appropriately. Well built and well nourished, not in acute distress.   HEENT: Normocephalic, atraumatic. Pupils are equally reacting to light and accommodation. Moist mucous membranes.   NECK: Supple. No JVD.   LUNGS: Clear to auscultation bilaterally. No wheezing. No crackles. No rhonchi.   CARDIOVASCULAR: S1, S2 normal. Regular rate and rhythm. PMI is intact.  GI: Soft. Bowel sounds are positive in four quadrants. Nontender, nondistended. No masses felt.   NEUROLOGIC: Awake, alert, and oriented x3. Cranial nerves II through XII are grossly intact.   EXTREMITIES: No edema. No cyanosis. Peripheral pulses are 2+.   SKIN: No lesions. No rashes. No lymphadenopathy.   LABS AND IMAGING STUDIES: CAT scan of the head is pending.   Chest x-ray no acute findings.   12-lead EKG no acute changes. Normal sinus rhythm.  Glucose 112, BUN 31, creatinine 1.31, initially it was at 1.17, sodium 139, potassium 5.7, chloride 108, CO2 27, GFR 43, anion gap 4, calcium 9.2, serum osmolality 285, alkaline phosphatase 151, AST 40, ALT 24, serum albumin 3.3. Total  CK 136. CK-MB 3.6. Initial troponin is 0.13, subsequently it bumped up to 0.42 in a period of two hours. WBC 12.1, hemoglobin 12.6, hematocrit 37.8, platelet count 236,000. Urinalysis nitrite negative, leukocyte esterase negative, straw color, glucose negative, bilirubin negative, ketones negative.   ASSESSMENT AND PLAN: This is an 81 year old Caucasian female presenting to the ER with elevated hypertension who will be admitted with the following assessment and plan.  1. Malignant hypertension probably from recurrent stenosis of right renal artery. Will admit her to Intensive Care Unit. Will continue her home medications, clonidine patch and metoprolol. Hydralazine 10 mg IV will be given on an as needed basis for systolic blood pressure greater than 170. Will consult Dr. Wyn Quaker, vascular surgeon, as there is a high probability that she might be having recurrent stenosis of right renal artery.  2. Positive troponin probably from malignant hypertension. Will cycle cardiac enzymes. Consult Cardiology Dr. Darrold Junker' group. ACS protocol will be implemented. Heparin drip will be started if CT head is negative.  3. Chronic renal insufficiency with a baseline creatinine of 1.8 as per previous medical records from September 2013. Will monitor renal function closely and avoid nephrotoxins.  4. Hyperkalemia. Will give her one dose of Kayexalate and repeat Chem-8 in a.m.  5. Will provide her GI prophylaxis.  6. DVT prophylaxis is not needed as the patient is on heparin drip.   CODE STATUS: She is FULL CODE.   The diagnosis  and plan of care was discussed in detail with the patient and her daughter who is bedside. They voiced understanding of the plan.   TOTAL CRITICAL CARE TIME SPENT: 60 minutes.    ____________________________ Ramonita Lab, MD ag:drc D: 05/26/2012 23:50:00 ET T: 05/27/2012 08:55:30 ET JOB#: [id number]  cc: Ramonita Lab, MD, <Dictator>, Rhona Leavens. Burnett Sheng, MD, Annice Needy, MD, Marcina Millard, MD   Ramonita Lab MD ELECTRONICALLY SIGNED 05/27/2012 22:46

## 2014-11-04 NOTE — Discharge Summary (Signed)
PATIENT NAME:  Sophia Bradley, Sophia Bradley MR#:  [id number] DATE OF BIRTH:  04/04/1926  DATE OF ADMISSION:  05/26/2012 DATE OF DISCHARGE:  05/31/2012  PRIMARY CARE PHYSICIAN: Dr. Burnett Sheng   CONSULTANTS:  1. Dr. Cherylann Ratel, nephrology  2. Dr. Lady Gary, cardiology. 3. Dr. Wyn Quaker, vascular surgery.   CHIEF COMPLAINT:  Elevated blood pressure.  DISCHARGE DIAGNOSES:  1. Malignant hypertension.  2. Renal artery stenosis status post stent.  3. Positive troponin, likely demand ischemia.  4. Possible subclavian artery stenosis.  5. Acute on chronic renal failure.  6. Hyperkalemia.  7. History of labile hypertension.  8. Status post stent placement with noninvasive study suggesting recurrent stenosis in 03/2012.  9. Hyperlipidemia.  10. Peripheral vascular disease.  11. Arthritis.  12. Phlebitis of the left leg.  13. Gastroesophageal reflux disease.   DISCHARGE MEDICATIONS:  1. Zyrtec 10 mg daily, one tab as needed.  2. Acetaminophen 500 mg, 1 to 2 tabs every four hours as needed for pain or fever.  3. Aspirin 81 mg daily.  4. Vitamin D2 50,000 international units once a week.  5. Pravastatin 20 mg once a day.  6. Furosemide 20 mg once a day in the morning.  7. Prilosec OTC 20 mg once a day.  8. Cyclobenzaprine 5 mg, 1 tab once a day.  9. Clonidine 0.2 mg, 2 tabs 2 times a day.  10. Citalopram 20 mg, 1 tab once a day.  11. Bystolic 10 mg 2 times a day.  12. Hydralazine 25 mg, 1 tab every eight hours. Stop trimethoprim and lisinopril.   DIET: Low sodium.   ACTIVITY: As tolerated.   DISCHARGE FOLLOWUP:  1. Please follow up with primary care physician and cardiologist within 1 to 2 weeks.  2. Please follow up with Dr. Festus Barren, vascular surgeon, within 2 to 4 weeks.  3. Check BMP within a week.   DISPOSITION: Home with Home Health.   HISTORY OF PRESENT ILLNESS: For full details of history and physical, please see the dictation on 05/27/2012 by Dr. Amado Coe. Briefly, this is an 81 year old female with  labile hypertension from renal artery stenosis who had a stent placed followed by restenosis back in September, peripheral vascular disease, and gastroesophageal reflux disease who came in with high blood pressure. On the day of admission her blood pressure was very uncontrolled and in the ER was 251/108. The patient also had a bump in her troponins had elevated potassium of 5.7 and was admitted to the hospitalist service. Dr. Wyn Quaker was consulted from vascular surgery.   SIGNIFICANT LABS AND IMAGING:  CT scan of the head without contrast: No acute intracranial process. Chest PA and lateral on arrival showing no acute disease of the chest. Ultrasound of the kidneys bilaterally showing mild cortical atrophy of both kidneys, greater on the right than the left. Also increased echotexture of renal cortex which may reflect an element of medical renal disease. No evidence of obstruction.   Initial creatinine 1.17, peak creatinine of 1.92 on 11/12. The last one is 1.3 on 11/13. Initial potassium was 5.4 and peak potassium 5.7, last potassium 3.7. Initial troponin 0.13, last troponin of 1.2. Initial WBC 12.1, last WBC of 10. Urine creatinine is 47.1, creatinine to protein ratio is 1444.  Random protein in the urine is 68. Urinalysis not suggestive of infection The patient also was found with small M spike.  HOSPITAL COURSE: The patient was admitted to the hospitalist service in the Critical Care Unit, started on p.o. as well as  IV blood pressure medications to bring down the blood pressure. The patient also was given Kayexalate for the hyperkalemia and the lisinopril and Lasix were held. The patient also had presented with headaches as well. She was started on nitroprusside drip. Blood pressure came down and she was taken to the OR on 11/11 for evaluation of the renal artery stenosis.  Because of restenosis which was seen during the procedure a stent was replaced. Blood pressure did come down afterwards. The patient did  have an increase in creatinine post surgery, which could have been because of hypertension, and was seen by nephrology. Further testing was done which shows renal ultrasound without any significant obstruction. She was also found with mild M spike on the SPEP sent by the nephrologist, and  she needs further evaluation by hematology for this. Per cardiology, the elevated troponin was likely demand ischemia. Her creatinine currently is back down. She was noted to have different blood pressure measurements on the right and left upper extremities, left being higher. I discussed the case with Dr. Wyn Quaker who believes the patient likely has some element of subclavian artery stenosis as well. However, he did not recommend any further investigation at this point and outpatient management and blood pressure control was recommended. The family has been aware of the different blood pressures in the upper extremities for multiple years and they state that Dr. Darrold Junker is also aware of this. I did discuss the possibility of having further investigation and possible stent need if she is symptomatic at a later date. At this point she will be discharged with outpatient followup.  TOTAL TIME SPENT: 40 minutes.    ____________________________ Krystal Eaton, MD sa:bjt D: 05/31/2012 13:17:03 ET T: 05/31/2012 14:24:53 ET JOB#: [id number]  cc: Krystal Eaton, MD, <Dictator> Rhona Leavens. Burnett Sheng, MD Krystal Eaton MD ELECTRONICALLY SIGNED 06/28/2012 11:02

## 2014-11-04 NOTE — Consult Note (Signed)
PATIENT NAME:  Sophia Bradley, Sophia Bradley MR#:  [id number] DATE OF BIRTH:  [birth year]  DATE OF CONSULTATION:  05/29/2012  REFERRING PHYSICIAN:  Dr. Mordecai Maes  CONSULTING PHYSICIAN:  Linsey Hirota Lizabeth Leyden, MD  REASON FOR CONSULTATION: Acute renal failure in the setting of chronic kidney disease stage III and history of known right renal artery stenosis status post recent intervention.   HISTORY OF PRESENT ILLNESS: The patient is a very pleasant 81 year old Caucasian female with past medical history of labile hypertension, recurrent right renal artery stenosis, chronic kidney disease stage III with fluctuating renal function with creatinine between 1.3 to 1.7, hyperlipidemia, peripheral vascular disease, osteoarthritis, phlebitis of the left lower extremity, gastroesophageal reflux disease, urinary incontinence with prolapsed bladder who presented to [facility] on 05/27/2012. Patient reports that she had quite elevated blood pressure at home with a systolic blood pressure as high as 250 and diastolic blood pressure greater than 108. The patient has known underlying right renal artery stenosis. She had recurrent right renal artery stenosis in September and had angioplasty then. She had original right renal artery stent placed on 12/09/2011. This admission the patient underwent right renal artery angioplasty once again. She was exposed to contrast dye and there was a possibility of distal embolization. In addition, the patient has been hypotensive over the past 24 hours. These likely are all contributing to patient's acute renal failure now. Patient was also on Lasix, however, this has been held temporarily. The patient has been started on 0.9 normal saline at 100 mL/h as well. Patient had some diminished appetite but denies nausea, vomiting, dysgeusia, or lower extremity edema at this time.   PAST MEDICAL HISTORY:  1. Labile hypertension.  2. Right renal artery stenosis with right renal artery stent  placement on 12/09/2011 with recurrent stenosis and PTA x2.  3. Chronic kidney disease, stage III, baseline creatinine fluctuating between 1.3 to 1.8.  4. Hyperlipidemia.  5. Peripheral vascular disease.  6. Osteoarthritis.  7. Gastroesophageal reflux disease.  8. Prolapsed bladder followed by [facility] in [place].    PAST SURGICAL HISTORY:  1. Stent placement in the right renal artery in 11/2011 with recurrent stenosis and PTA x2 thereafter.  2. Dilatation and curettage in the past.  3. Esophageal dilatation 1995.  4. Tonsillectomy.  5. Appendectomy.  6. Bilateral knee replacement.  7. Bilateral cataract removal.  8. Right carotid endarterectomy.   ALLERGIES: Sulfa.   CURRENT INPATIENT MEDICATIONS:  1. 0.9 normal saline at 100 mL/h.  2. Aspirin 325 mg daily.  3. Celexa 20 mg p.o. daily.  4. Clonidine 0.2 mg p.o. b.i.d.  5. Nitroglycerin 0.4 mg sublingual every five minutes p.r.n. chest pain.  6. Zofran 4 mg IV every six hours p.r.n.  7. Protonix 40 mg p.o. every 6:00 a.m.  8. Hydralazine 10 mg IV q.4 hours p.r.n. systolic blood pressure greater than 160.  9. Metoprolol 50 mg p.o. every 12 hours.  10. Pravachol 40 mg p.o. at bedtime.  11. Phenergan 25 mg IV q.6 hour p.r.n. nausea.  12. Flexeril 5 mg p.o. at bedtime.   SOCIAL HISTORY: Patient lives in [place] towards the [place]. She lives at home with her daughter and son-in-law. She quit smoking approximately 10 years ago. Drinks wine occasionally. Denies illicit drug use. Mother died of cerebrovascular accident. Father died of myocardial infarction. Patient had a sister who had end-stage renal disease.    REVIEW OF SYSTEMS: CONSTITUTIONAL: Denies fevers, chills, or weight loss. EYES: Had bilateral cataract removal. HENT:  Denies headaches, hearing loss. Denies epistaxis. CARDIOVASCULAR: Denies chest pain, palpitations, PND. RESPIRATORY: Denies cough, shortness of breath, hemoptysis. GASTROINTESTINAL: Denies  nausea, vomiting. Reports diminished appetite. GENITOURINARY: Denies frequency, urgency, dysuria. MUSCULOSKELETAL: Denies joint pain, swelling, or redness. INTEGUMENTARY: Denies skin rashes or lesions. NEUROLOGICAL: Denies focal extremity numbness, weakness. PSYCH: Has history of depression. ENDOCRINE: Denies polyuria, polydipsia, polyphagia. HEMATOLOGIC/LYMPHATIC: Denies easy bruisability, bleeding, or swollen lymph nodes. ALLERGY/IMMUNOLOGIC: Denies seasonal allergies or history of immunodeficiency.   PHYSICAL EXAMINATION:  VITAL SIGNS: Temperature 97.8, pulse 79, respirations 20, blood pressure 147/65, however, blood pressure has been as low as 87/46 this a.m.   GENERAL: Well-developed, well-nourished Caucasian female who appears her stated age, currently in no acute distress.   HEENT: Normocephalic, atraumatic. Extraocular movements are intact. Pupils were both dilated at about 8 mm and were slow to react. Patient has had bilateral cataract removal, however, no epistaxis noted. Gross hearing intact. Oral mucosa moist.   NECK: Supple without JVD or lymphadenopathy.   LUNGS: Clear to auscultation bilaterally with normal respiratory effort.   CARDIOVASCULAR: S1, S2 regular rate and rhythm. No murmurs, rubs, or gallops appreciated.   ABDOMEN: Soft, nontender, nondistended. Bowel sounds positive. No rebound or guarding. No gross organomegaly appreciated.   EXTREMITIES: No clubbing, cyanosis, or edema.   NEUROLOGIC: Patient is alert and oriented to time, person, and place. Strength is 5/5 in both upper and lower extremities.   SKIN: Warm and dry. No rashes noted. No obvious embolic phenomena noted in the hands or in the feet.   PSYCHIATRIC: Patient with appropriate affect and appears to have good insight into her current illness.   GENITOURINARY: No suprapubic tenderness noted at this time.   LABORATORY, DIAGNOSTIC AND RADIOLOGICAL DATA: Sodium 135, potassium 3.7, chloride 102, CO2 24, BUN  26, creatinine 1.92, glucose 86, total protein 8.0, albumin 3.3, total bilirubin 0.3, alkaline phosphatase 151, AST 40, ALT 24, troponin I 1.20 at its peak. CBC shows WBC 10, hemoglobin 11, hematocrit 31, platelets 205. Urinalysis shows urine protein of 100 mg/dL.   IMPRESSION: This is an 81 year old Caucasian female with past medical history of labile hypertension, right renal artery stenosis with original stent placement in 11/2011 with PTA x2 thereafter, chronic kidney disease stage III, baseline creatinine 1.4 to 1.8, hyperlipidemia, peripheral vascular disease, osteoarthritis, gastroesophageal reflux disease, depression, vitamin D deficiency who presented to [facility] with malignant hypertension:  1. Acute renal failure/chronic kidney disease, stage III. The patient has obvious acute renal failure at this time. The patient has acute renal failure from multiple reasons. She had recent contrast administration. She could potentially have had an embolic event to the right kidney as well. The patient was also hypotensive which likely altered renal hemodynamics. At this point in time there is no urgent indication for dialysis. However, she will need continued observation of her renal function. We will obtain a renal ultrasound to exclude obstruction. I agree with attempts at IV fluid hydration with 0.9 normal saline as she was hypotensive earlier. Would avoid further renal insult at this time and avoid any further contrast administration at this time. We will continue to monitor renal function daily.  2. Malignant hypertension. Blood pressure was quite high upon admission. However, now blood pressure has been low over the past 24 hours. I am hesitant to abruptly discontinue clonidine given the possibility for rebound hypertension. I agree with providing the patient with IV fluid hydration with 0.9 normal saline. Continue to follow blood pressure curve.  3. Proteinuria.  Urine protein was  noted as being 100 mg/dL. We will check SPEP, UPEP, and quantify proteinuria a bit further with urine protein to creatinine ratio.  4. I would like to thank Dr. Mordecai Maes for this kind referral. Further plan as patient progresses.   ____________________________ Lennox Pippins, MD mnl:cms D: 05/29/2012 13:23:36 ET T: 05/29/2012 13:57:40 ET JOB#: [id number]  cc: Lennox Pippins, MD, <Dictator> Lennox Pippins MD ELECTRONICALLY SIGNED 06/02/2012 13:34

## 2014-11-07 NOTE — Consult Note (Signed)
PATIENT NAME:  Sophia Bradley, Sophia Bradley MR#:  [id number] DATE OF BIRTH:  [birth year]  DATE OF CONSULTATION:  08/06/2012  REFERRING PHYSICIAN:  Marlaine Hind, MD   CONSULTING PHYSICIAN:  Marcina Millard, MD PRIMARY CARE PHYSICIAN: Rhona Leavens. Burnett Sheng, MD   CHIEF COMPLAINT: My blood pressure is up.   REASON FOR CONSULTATION: Consultation is requested for evaluation of malignant hypertension and elevated troponin.   HISTORY OF PRESENT ILLNESS: The patient is an 81 year old female with known history of problematic systemic hypertension as well as renal artery stenosis, status post stent, followed by Dr. Wyn Quaker. The patient apparently was in her usual state of health until yesterday, while watching football she decided to take her blood pressure and noted that her systolic blood pressure was above 200. The patient came to [facility] where initial blood pressure was 272/102. The patient was admitted to telemetry where she has been resumed on her Bystolic but has been taking labetalol intravenous on a p.r.n. basis. The patient has continued on her clonidine. The patient has borderline elevated troponin of 0.16 in the absence of chest pain or ECG changes. Telemetry has shown intermittent atrial fibrillation with a rapid ventricular rate of 120 BPM.   PAST MEDICAL HISTORY: 1. Hypertension.  2. Renal artery stenosis. 3. Status post right carotid endarterectomy.  4. Hyperlipidemia.   MEDICATIONS:  Aspirin 81 mg daily, clonidine 0.6 mg b.i.d., furosemide 20 mg daily, Bystolic 10 mg b.i.d., Pravastatin 20 mg daily, Prilosec 20 mg daily, Zyrtec 10 mg daily.   SOCIAL HISTORY: The patient is a widow. She currently resides  in [place] and lives with her daughter. She denies tobacco abuse.   FAMILY HISTORY: Father died status post MI at age 42.     REVIEW OF SYSTEMS:  CONSTITUTIONAL: No fever or chills.  EYES: No blurry vision.  EARS: No hearing loss.  RESPIRATORY: No shortness of breath.  CARDIOVASCULAR:  No chest pain.  GASTROINTESTINAL: The patient had some mild nausea this morning.  GENITOURINARY: The patient denies dysuria, hematuria.  ENDOCRINE: No polyuria or polydipsia.  MUSCULOSKELETAL: No arthralgias or myalgias.  NEUROLOGICAL: No focal muscle weakness or numbness.  PSYCHOLOGICAL: No depression or anxiety.   PHYSICAL EXAMINATION: VITAL SIGNS: Blood pressure 228/140, pulse 116, respirations 18, temperature 97.6, pulse oximetry 96%.  HEENT: Pupils equal, reactive to light and accommodation.  NECK: Supple without thyromegaly.  LUNGS: Clear.  HEART: Normal JVP. Normal PMI. Irregular, irregular rhythm. Normal S1, S2. No appreciable gallop, murmur, or rub.  ABDOMEN: Soft and nontender. Pulses were intact bilaterally.  MUSCULOSKELETAL: Normal muscle tone.  NEUROLOGICAL: The patient is alert and oriented x 3. Motor and sensory are both grossly intact.   IMPRESSION: This is an 81 year old female who presents with malignant hypertension in the absence of chest pain. The patient has borderline elevated troponin which is likely demand/supply ischemia. The patient has had a history of problematic hypertension on multiple medications, previously intolerant of amlodipine. The patient has known renal artery stenosis and is scheduled to have further evaluation by Dr. Wyn Quaker.   RECOMMENDATIONS: 1. I agree with overall current therapy.  2. I would start Cardizem 60 mg q. 6, especially in light of new onset atrial fibrillation.  3. The patient has a CHADS2 score of 2 and could benefit from chronic anticoagulation. I am hesitant to start anticoagulation at this time in light of markedly elevated blood pressure. Will need to consider starting chronic anticoagulation once blood pressures normalize or as an outpatient.  4. I agree with  further work-up to evaluate renal artery stenosis per Dr. Wyn Quaker.  5. Pending evaluation of renal artery stenosis, may consider ACE and/or ARB therapy. In light of patient's  markedly elevated blood pressure, she would benefit from multiple hypertensive medications including beta blocker, calcium channel blocker, ACE inhibitor and/or ARB, as well as central-acting medications such as clonidine.   ____________________________ Marcina Millard, MD ap:cb D: 08/06/2012 12:01:52 ET T: 08/06/2012 12:20:11 ET JOB#: [id number]  cc: Marcina Millard, MD, <Dictator> Marcina Millard MD ELECTRONICALLY SIGNED 09/11/2012 14:56

## 2014-11-07 NOTE — Discharge Summary (Signed)
PATIENT NAME:  Sophia Bradley, Sophia Bradley MR#:  [id number] DATE OF BIRTH:  1925/12/05  DATE OF ADMISSION:  08/05/2012 DATE OF DISCHARGE:  08/07/2012  PRESENTING COMPLAINT: Uncontrolled hypertension and mild dizziness.   DISCHARGE DIAGNOSES:  1.  Accelerated/malignant hypertension, improved.  2.  History of renal artery stenosis, status post stenting in the right renal artery in the past.  3.  Headache.  4.  Chronic atrial fibrillation.   DISCHARGE CONDITION: Blood pressure 116/75 on discharge.   CODE STATUS: Full code.   MEDICATIONS:  1.  Aspirin 81 mg daily.  2.  Pravastatin 20 mg at bedtime.  3.  Lasix 20 mg daily.  4.  Prilosec 20 mg delayed-release p.o. daily.  5.  Bystolic 10 mg b.i.d. (nebivolol 10 mg b.i.d.).  6.  Clonidine 0.2 mg 3 tablets twice a day, which is clonidine 0.6 mg b.i.d.  7.  Vitamin D2 at 1.25 mg p.o. once a week on Wednesday.  8.  Zyrtec 10 mg daily.  9.  Calcium with vitamin D 1 tablet daily.  10.  Diltiazem CD 180 mg p.o. daily.  11.  Fioricet 1 tablet every 6 hours as needed for headache.   DIET: Low sodium diet.   FOLLOWUP:  1.  Follow up with Dr. Darrold Junker 08/22/2012 at 11:45 a.m.  2.  Follow up with Dr. Burnett Sheng 09/20/2012 at 10:45 a.m.  3.  Follow up with Dr. Wyn Quaker in 2 to 3 months with renal duplex.   LABORATORY DATA AT DISCHARGE: Troponin 0.23. Chemistries within normal limits. UA negative for UTI. CBC within normal limits.   HOSPITAL COURSE: The patient is an 81 year old Caucasian female with history of chronic A. fib, hypertension, renal artery stenosis, comes in with:  1.  Malignant/accelerated hypertension. The patient has intolerance to BP med. The patient's clonidine was resumed and also her Bystolic was continued. Dr. Darrold Junker' input was appreciated. He added Cardizem CD, which the patient tolerated well. P.r.n. labetalol was given for labile BP.  2.  Hyperkalemia, resolved. Potassium was down to 3.7 prior to discharge.  3.  Renal artery stenosis,  status post stent implant in November 2013. The patient underwent renal arteriogram with Dr. Wyn Quaker, remained normal. It was done given elevated blood pressure and to make sure patient does not have renal artery stenosis with uncontrolled hypertension. Her renal arteriogram remained normal.  4.  Elevated troponin, likely secondary to demand ischemia from severe hypertension. The patient remained chest pain-free. We continued to monitor and the patient did not have any more chest pain. 5.  Peripheral vascular disease, on aspirin and statins.  6.  Hyperlipidemia, on Pravachol.   Hospital stay otherwise remained stable. The patient remained a full code.   TIME SPENT: 40 minutes.    ____________________________ Wylie Hail Allena Katz, MD sap:jm D: 08/09/2012 14:26:39 ET T: 08/09/2012 15:54:03 ET JOB#: [id number]  cc: Lynniah Janoski A. Allena Katz, MD, <Dictator> Marcina Millard, MD Rhona Leavens. Burnett Sheng, MD Annice Needy, MD Willow Ora MD ELECTRONICALLY SIGNED 08/10/2012 21:33

## 2014-11-07 NOTE — Op Note (Signed)
PATIENT NAME:  Sophia Bradley, Sophia Bradley MR#:  [id number] DATE OF BIRTH:  25-Feb-1926  DATE OF PROCEDURE:  10/10/2012  PREPROCEDURE DIAGNOSIS: Sick sinus syndrome.   PROCEDURE: Dual chamber pacemaker implantation.   POSTPROCEDURE DIAGNOSIS: Atrial sensing with ventricular pacing.  SURGEON: Marcina Millard, MD     INDICATION: The patient is an 81 year old female who is markedly bradycardic. The patient has problematic hypertension on multiple blood pressure medications. The patient has been experiencing increasing fatigue, and recent Holter monitor revealed marked bradycardia with mean heart rate of 43 beats per minute.  The patient requires multiple antihypertensive medications despite being bradycardic. The procedure, risks, benefits and alternatives of pacemaker implantation were explained to the patient, and informed written consent was obtained.   DESCRIPTION OF PROCEDURE: She was brought to the operating in a fasting state. The left pectoral region was prepped and draped in the usual sterile manner. Anesthesia was obtained with 1% Xylocaine locally. A 6 cm incision was performed over the left pectoral region. Access was obtained to the left subclavian vein by fine needle aspiration. Medtronic ventricular and atrial leads were positioned in the right ventricular apex and right atrial appendage under fluoroscopic guidance. After proper thresholds were attained, the leads were sutured in place. The pacemaker pocket was irrigated with gentamicin solution. The leads were connected to a dual chamber rate-responsive pacemaker generator (Medtronic Adapta [id number]) and positioned in pocket. The pocket was closed with 2-0 and 4-0 Vicryl, respectively. Steri-Strips and pressure dressing were applied.  ____________________________ Marcina Millard, MD ap:cb D: 10/10/2012 13:55:46 ET T: 10/10/2012 14:39:27 ET JOB#: [id number]  cc: Marcina Millard, MD, <Dictator> Marcina Millard MD ELECTRONICALLY SIGNED  10/23/2012 12:36

## 2014-11-07 NOTE — H&P (Signed)
PATIENT NAME:  Sophia Bradley, Sophia Bradley MR#:  [id number] DATE OF BIRTH:  [birth year]  DATE OF ADMISSION:  08/05/2012  PRIMARY CARE PHYSICIAN:  Dr. Jerl Mina  CARDIOLOGIST:  Dr. Marcina Millard  VASCULAR SURGEON:  Dr. Festus Barren  REFERRING PHYSICIAN:  Dr. Tora Duck L. Mindi Junker  CHIEF COMPLAINT:  Uncontrolled hypertension and mild dizziness.   HISTORY OF PRESENT ILLNESS:  The patient is an 81 year old Caucasian female with history of severe systemic hypertension, renal artery stenosis status post stent implant, the last time was in November of last year done by Dr. Festus Barren. The patient was doing reasonably well. She stated that her blood pressure is reasonably controlled in the range of 130 systolic, average  last week. However today after watching the football, she checked her blood pressure and it was reaching 212 systolic. She took an extra clonidine tablet and she laid down. After 2 hours, she checked her blood pressure and it was 252 systolic and diastolic above 100. At that time, she decided to come to the Emergency Department. Evaluation here also revealed severe hypertensive crisis reaching blood pressure of 272/102. We also noted slight elevation of troponin and also evidence of hyperkalemia. The patient at this point does not complain of any chest pain. No shortness of breath. Of note, going back to her records in November when she was admitted here with hypertensive crisis, her troponin was also elevated and was attributed to increased demand.   REVIEW OF SYSTEMS:  CONSTITUTIONAL: Denies any fever. No chills. No fatigue.  EYES: No blurring of vision. No double vision.  ENT: No hearing impairment. No sore throat. No dysphagia.  CARDIOVASCULAR: Denies any chest pain. No shortness of breath. No syncope. No peripheral edema.  RESPIRATORY: No shortness of breath. No chest pain. No cough.  GASTROINTESTINAL: No abdominal pain, no vomiting, and no diarrhea.  GENITOURINARY: No dysuria. No  frequency of urination.  MUSCULOSKELETAL: No joint pain or swelling. No muscular pain or swelling.  INTEGUMENTARY: No skin rash. No ulcers.  NEUROLOGY: No focal weakness. No seizure activity. No headache, but reported mild dizziness.  PSYCHIATRY: No anxiety. No depression.  ENDOCRINE: No polyuria or polydipsia. No heat or cold intolerance.  HEMATOLOGY: No easy bruisability. No lymph node enlargement.   PAST MEDICAL HISTORY:  Severe systemic hypertension, right renal artery stenosis status post stent placement, last time was in November 2013 and the procedure was done by Dr. Festus Barren. Her last admission to this hospital was in November 2013 with hypertensive crisis and malignant hypertension associated with mild elevation of troponin. Also, she was noted to have hyperkalemia. The patient also has a history of peripheral vascular disease status post right carotid endarterectomy, hyperlipidemia, arthritis, gastroesophageal reflux disease.   PAST SURGICAL HISTORY:  Right carotid endarterectomy, right renal artery stenosis status post stent placement, esophageal dilatation in 1995, appendectomy, tonsillectomy, D and C, right knee joint replacement, left knee joint replacement, cataract repair.   SOCIAL HABITS:  Nonsmoker. No history of alcohol abuse. She may drink wine once in a while. No drug abuse.   SOCIAL HISTORY:  She is widowed. Lives at home. I understood that her daughter and her husband both are in good health.   FAMILY HISTORY:  Both parents had hypertension and siblings as well.   ADMISSION MEDICATIONS:  Clonidine 0.2 mg taking 3 tablets twice a day, furosemide 20 mg once a day, Bystolic 10 mg twice a day, aspirin 81 mg a day, calcium with vitamin D once a day, pravastatin 20  mg a day, Prilosec 20 mg a day, vitamin D2, 50,000 units once a week, Zyrtec 10 mg a day.   ALLERGIES:  SULFA CAUSING SKIN RASH, HYDROCHLOROTHIAZIDE CAUSING SWELLING AND NORVASC CAUSING SWELLING AS WELL _____  PERIPHERAL EDEMA.   PHYSICAL EXAMINATION: VITAL SIGNS: Blood pressure 273/106, respiratory rate 18, pulse 74, temperature 97.5, oxygen saturation 98%.  GENERAL APPEARANCE: Elderly pleasant female lying down in bed in no acute distress.  HEAD AND NECK: No pallor. No icterus. No cyanosis. Ear examination revealed normal hearing. No ulcers. No discharge. Nasal mucosa was normal, no discharge, no bleeding. No ulcers. Oropharyngeal area was normal without lesions. No ulcers. No oral thrush. Eye examination revealed normal eyelids and conjunctivae. There is arcus senilis. Pupils are about 4 to 5 mm, round, equal, sluggishly reactive to light.  NECK: Supple. Trachea at midline. No thyromegaly. No cervical lymphadenopathies. No masses.  HEART: Normal S1, S2. No S3, S4. No murmur. No gallop. No carotid bruits.  RESPIRATORY: Normal breathing pattern without use of accessory muscles. No rales. No wheezing.  ABDOMEN: Soft without tenderness. No hepatosplenomegaly. No masses. No hernias.  SKIN: No ulcers. No subcutaneous nodules.  MUSCULOSKELETAL: No joint swelling. No clubbing.  NEUROLOGIC: Cranial nerves II through XII are intact. No focal motor deficits. Sense of touch is preserved.  PSYCHIATRIC: The patient is alert is alert and oriented x 3. Mood and affect were normal.   DIAGNOSTIC DATA:  EKG showed normal sinus rhythm with rate of 60 per minute; unremarkable EKG, although there is a suggestion for slightly peaked T wave in leads V2 and V3; however, this is not different and unchanged from her EKG that was done in November. Serum glucose was 101, BUN 18, creatinine 0.6, sodium 138 and potassium 5.9. Total protein was 7.9, albumin 3.3, bilirubin 0.7, alkaline phosphatase 185, AST 67, ALT 27. Troponin was 0.13. Of note, her troponin in November was 0.33. CBC showed white count 10,000, hemoglobin 12, hematocrit 38 and platelet count 220.   IMPRESSION:   1.  Malignant hypertension.  2.  Hyperkalemia.  3.   Right renal artery stenosis status post stent implant in November 2013.  4.  Elevated troponin. The patient is asymptomatic and without EKG changes likely secondary to increased demand severe hypertension.  5.  Peripheral vascular disease.  6.  Hyperlipidemia.   PLAN:  We will admit the patient to the medical floor on telemetry monitoring. Hypertensive crisis management. She received 10 mg of IV labetalol and also nitroglycerin ointment was placed. Furosemide was given. Follow up on troponin. For treatment of hyperkalemia, she received Kayexalate. We will monitor the potassium and I will repeat the basic metabolic profile. I will consult her vascular surgeon, Dr. Wyn Quaker, since he is planning to do a Doppler ultrasound this coming Tuesday. The patient is also requesting to see her cardiologist, Dr. Darrold Junker and therefore I will consult him. He will also comment on the elevated troponin. I will avoid exceeding the recommended dose of clonidine since this may result in hyperkalemia.   CODE STATUS:  Full code.   TIME SPENT IN EVALUATING THIS PATIENT AND REVIEWING MEDICAL RECORDS:  Took more than 55 minutes.    ____________________________ Carney Corners. Rudene Re, MD amd:si D: 08/05/2012 23:32:00 ET T: 08/06/2012 00:20:39 ET JOB#: [id number]  cc: Carney Corners. Rudene Re, MD, <Dictator> Zollie Scale MD ELECTRONICALLY SIGNED 08/06/2012 7:05

## 2014-11-07 NOTE — Op Note (Signed)
PATIENT NAME:  Sophia Bradley, Sophia Bradley MR#:  [id number] DATE OF BIRTH:  01/28/1926  DATE OF PROCEDURE:  08/06/2012  PREOPERATIVE DIAGNOSES: 1.  Renal artery stenosis with previous right renal artery intervention.  2.  Malignant hypertension, prompting admission.  3.  Acute renal insufficiency with increasing creatinine.   POSTOPERATIVE DIAGNOSIS:  1.  Renal artery stenosis with previous right renal artery intervention.  2.  Malignant hypertension, prompting admission.  3.  Acute renal insufficiency with increasing creatinine.   PROCEDURE: 1.  Ultrasound guidance for vascular access, right femoral arteries.  2.  Catheter placement into bilateral renal arteries from right femoral approach.  3.  Aortogram and renal angiogram.  4.  Pressure measurements, bilateral renal artery and aorta.  5.  StarClose closure device, right femoral artery.   SURGEON: Annice Needy, M.D.   ANESTHESIA: Local with moderate conscious sedation.   ESTIMATED BLOOD LOSS: Minimal.   INDICATION FOR PROCEDURE: This is an 81 year old white female who was admitted today with severe malignant hypertension. She had blood pressures in excess of 240 systolic over 100 diastolic. She has a previous history of renal artery stenosis status post renal artery intervention for recurrent stenosis last year only 2 to 3 months ago. As we do not have renal artery duplex available in the hospital, the only accurate way to assess her renal artery perfusion will be with angiography for further evaluation. Pressure measurements performed as part of procedure. Risks and benefits were discussed. Informed consent was obtained.   DESCRIPTION OF PROCEDURE: The patient is brought to the vascular interventional radiology suite. Groin was shaved and prepped and a sterile surgical field was created. Due to multiple previous accesses, the right femoral artery was accessed under direct ultrasound guidance without difficulty with a Seldinger needle and  permanent image was recorded. A 5 French sheath was placed, 2000 units of intravenous heparin for systemic anticoagulation. A pigtail catheter was placed in the aorta just above the previously placed visualized right renal artery stent and aortogram was performed. On this aortogram, the stent appeared patent. There appeared to be mild intimal hyperplasia with no stenosis greater than 20 to 30% throughout the entirety of the stent. No focal stenoses identified. The left renal artery had no significant stenosis either with minimal irregularity at the proximal portion. I selectively cannulated both renal arteries sequentially with a C2 catheter. Limited amounts of contrast used due to her acute renal insufficiency and pressure measurements were performed. This showed good pulsatile flow in both distal main renal arteries without significant gradients. As the catheter was backed out into the aorta on the left, there was less than 5 mmHg gradient. On the right, there was 10 mmHg or less gradient identified without a change in pulsatile waveform back into the aorta.  At this point with no pressure gradient and no visualize stenosis of any significance, it did not appear that recurrent renal artery stenosis is the cause of a malignant hypertension and I elected to terminate the procedure. The diagnostic catheter was removed, oblique arteriogram was performed and a StarClose closure device was deployed in the usual fashion with excellent hemostatic result. The patient tolerated the procedure well was taken to the recovery room in stable condition.   TOTAL CONTRAST USED: 14 mL  FLUOROSCOPY TIME: Approximately 3 minutes.     ____________________________ Annice Needy, MD jsd:cc D: 08/06/2012 15:50:00 ET T: 08/06/2012 23:33:25 ET JOB#: [id number]  cc: Annice Needy, MD, <Dictator> Annice Needy MD ELECTRONICALLY SIGNED 08/09/2012 9:10

## 2014-11-07 NOTE — Consult Note (Signed)
Patient known to me for previous renal artery interventions for severe, poorly controlled HTN.  Admitted with worsenind HTN and BP well over 200 systolic and 100 diastolic with headaches and nausea.  Also with acute worsening of renal function.  At this point, her BP is basically not controllable and she has presented like this with previous issues of renal artery stenosis.  Suspect a recurrent RAS, and discussed with Dr. Darrold Junker who is seeing her from Cardiology and knows her well.  Will angiogram today to evaluate her renals and treat if necessary  Electronic Signatures: Annice Needy (MD)  (Signed on 20-Jan-14 13:14)  Authored  Last Updated: 20-Jan-14 13:14 by Annice Needy (MD)

## 2014-11-08 NOTE — Discharge Summary (Signed)
PATIENT NAME:  Sophia Bradley, Sophia Bradley MR#:  [id number] DATE OF BIRTH:  1925/12/28  DATE OF ADMISSION:  02/27/2014 DATE OF DISCHARGE:  03/01/2014  PRIMARY CARE PHYSICIAN: Dr. Burnett Sheng.   ADMITTING DIAGNOSES: Colitis and dehydration, acute on chronic renal insufficiency.   DISCHARGE DIAGNOSES:  1. Acute proctocolitis.  2. Dehydration resolved.  3. Acute on chronic kidney failure.  4. Urinary tract infection is ruled out with negative cultures.  5. Review H and P and hospital course.  6. Moderate malnutrition.   PROCEDURES: None.   CONSULTATIONS: None.  HISTORY OF PRESENT ILLNESS AND HOSPITAL COURSE: The patient is an 81 year old female who came into the ED with a chief complaint of nausea and vomiting. The patient was constipated for 1 week prior to this admission and was having small bowel movement which were solid like which was nonbloody. Please review the history and physical for details. The patient was admitted to the hospital with a chief complaint of colitis.  The patient was placed on IV ciprofloxacin, vancomycin and p.o. Flagyl.   During the hospital course vancomycin was discontinued and IV ciprofloxacin and Flagyl were continued. Blood cultures and urine cultures were followed. The patient's blood and urine cultures were negative. With IV fluids renal insufficiency is improved back to baseline.  Dehydration was resolved, acute kidney failure was resolved, urinary tract infection is ruled out with negative urine cultures. Overall condition is improved. The patient was given laxatives regarding the constipation. She denies any bloody bowel movement. Oral condition is clinically improved. The patient was discharged home in stable condition.   MEDICATIONS AT  THE TIME OF DISCHARGE: Aspirin 81 mg p.o. once daily,    20 mg p.o. at bedtime as her transaminitis is resolved, furosemide 20 mg 1 tablet by mouth daily,  Prilosec 20 mg p.o. once daily in a.m. clonidine 0.2 mg 3 tablets p.o. 2 times a  day, vitamin D 50,000 international units 1 capsule p.o. once a week on Wednesdays, Zyrtec 10 mg daily 1 tablet p.o. once a day as needed for allergies, calcium with vitamin D 1 tablet p.o. once daily, acetaminophen with butalbital, caffeine one tablet  p.o. every 6  hours as needed for headache, Bystolic 10 mg 1 tablet p.o. 2 times a day, Cartia Extended Release 180 mg once daily, VESIcare 5 mg p.o. once daily, metronidazole 500 mg p.o. q. 8 hours for 5 days, Levaquin 500 mg p.o. every 24 hours for 5 days.   ACTIVITY: As tolerated.   DIET: Low-fat, low-cholesterol follow-up with primary care physician in one week.   LABORATORY AND IMAGING STUDIES: CT of the abdomen and pelvis which was performed on August 12th has revealed  proctocolitis. LFTs: Total protein 6.0, albumin 2.3, AST is 43, the rest of the labs were normal. Creatinine 1.25, sodium 142, potassium 4.1, white count is 10.7. Blood and urine cultures with no growth.   The plan of care was discussed with the patient. She verbalized understanding of the plan. She was discharged home on stable condition.  TOTAL TIME SPENT ON DISCHARGE:  40 minutes.    ____________________________ Ramonita Lab, MD ag:JT D: 03/01/2014 13:50:50 ET T: 03/01/2014 23:27:08 ET JOB#: [id number]  cc: Ramonita Lab, MD, <Dictator> Rhona Leavens. Burnett Sheng, MD Ramonita Lab MD ELECTRONICALLY SIGNED 03/06/2014 16:09

## 2014-11-08 NOTE — H&P (Signed)
PATIENT NAME:  Sophia Bradley, Sophia Bradley MR#:  [id number] DATE OF BIRTH:  1925/10/27  DATE OF ADMISSION:  02/27/2014   REFERRING PHYSICIAN: Bobetta Lime A. Inocencio Homes, MD  PRIMARY CARE DOCTOR: Rhona Leavens. Burnett Sheng, MD  ADMITTING DIAGNOSES: 1.  Colitis.  2.  Dehydration.  3.  Constipation.  4.  Acute on chronic kidney disease.   HISTORY OF PRESENT ILLNESS: This is in 81 year old female who presents to the Emergency Department with nausea and vomiting. The patient states that she has been constipated for approximately 1 week. She has been able to have bowel movements of only small pellet size. They are nonbloody. Straining is painful, but actual defecation is mildly relieving. The patient denies any fevers, but says she feels generally week.    REVIEW OF SYSTEMS:  CONSTITUTIONAL: The patient denies fevers or weight loss.  EARS, NOSE, AND THROAT: The patient denies decreased visual acuity. The patient admits to dysphagia with meat and bread.  RESPIRATORY: The patient denies cough or shortness of breath.  CARDIOVASCULAR: The patient denies chest pain or and dizziness.  GASTROINTESTINAL: The patient denies diarrhea. She admits to abdominal pain, as well as nausea and vomiting.  GENITOURINARY: The patient denies dysuria, but admits to increased frequency and hesitancy.  ENDOCRINE; the patient denies nocturia or polyuria.  HEMATOLOGIC AND LYMPHATIC: The patient admits to easy bruising, but denies actual bleeding.  INTEGUMENTARY: The patient denies rashes or lesions.  MUSCULOSKELETAL: The patient denies myalgias, but admits to some chronic knee pain.  NEUROLOGIC: The patient admits to dizziness, but denies numbness or weakness. She also denies difficulty speaking.  PSYCHIATRIC: The patient denies suicidal ideation or depression.   PAST MEDICAL HISTORY: Significant for: Hypertension, hyperlipidemia, atrial fibrillation, constipation, and a history of falling.   PAST SURGICAL HISTORY: The patient had bilateral cataract  removal, bilateral knee replacements, carotid endarterectomy, appendectomy, pacemaker placement, renal artery stent placement, femoral-popliteal bypass, as well as an esophageal stricture dilatation approximately 2 weeks ago.   MEDICATIONS:  1.  Zyrtec 10 mg 1 tablet p.o. daily.   2.  Vitamin D2, 50 international units, 1 capsule p.o. weekly on Wednesdays.  3.  VESIcare 5 mg, 1 tablet p.o. daily.  4.  Prilosec 20 mg 1 tablet p.o. q.a.m.  5.  Pravastatin 20 mg 1 tablet p.o. q.h.s. 6.  Furosemide 20 mg 1 tablet p.o. q.a.m.  7.  Clonidine 0.2 mg, 3 tablets p.o. b.i.d.  8.  Cardia XT 180 mg capsule, 1 capsule p.o. daily.  9.  Calcium 600 international units 1 tablet p.o. daily.  10.  Bystolic 10 mg, 1 tablet p.o. b.i.d.  11.  Aspirin 81 mg 1 tablet p.o. daily.  12.  Fioricet 1 tablet p.o. q. 6 hours as needed for headache.   ALLERGIES: ADVAIR, SULFA DRUGS, NORVASC, AND HYDROCHLOROTHIAZIDE.   SOCIAL HISTORY: The patient lives with her daughter and son-in-law. She denies smoking, drinking, or doing any drugs.   PERTINENT LABORATORY RESULTS AND RADIOLOGIC FINDINGS: BUN 26, creatinine 2.38, sodium 134, serum bicarbonate 17, alkaline phosphatase 161, AST 55. Troponin was 0.07. White blood cell count was 39.6. Urinalysis showed 3+ leukocyte esterase as well as 1000 white blood cells per high-power field, some hyaline casts, as well as a specific gravity of 1.012. A CT of the abdomen and pelvis does not show any colonic dilatation. There is no pneumoperitoneum. There is no pathologic distention of the small bowel and no lymphadenopathy identified in the abdomen and pelvis. Abdominal aorta has extensive atherosclerosis, but no aneurysm. The stent  is in place in the right renal artery. There is a small volume of reactive free fluid. Also, the lung bases have the appearance of underlying interstitial lung disease. There is a patulous, fluid-filled esophagus. On the acute abdomen and pelvis film, there is  moderate increase stool throughout the colon and rectum without obstruction.   PHYSICAL EXAMINATION:  VITAL SIGNS: Temperature 97.8, pulse 68, respirations 20, blood pressure 191/76, pulse oximetry 96% on room air.  GENERAL: The patient is alert and oriented x 3, in no apparent distress.  HEENT: The patient is normocephalic, but has a bruise on her left temple that is in its latter stages of healing. Extraocular movements are intact. Pupils were equal, round, and reactive to light and accommodation.  OROPHARYNX: Without exudate or erythema. Mucous membranes are very dry.  NECK: Trachea is midline. No adenopathy, cervical adenopathy or supraclavicular adenopathy.  CHEST: Symmetric and atraumatic.  CARDIOVASCULAR: Regular rate and rhythm. Normal S1, S2. No rubs, clicks, or murmurs.  LUNGS: Clear to auscultation bilaterally. Normal effort and excursion.  ABDOMEN: Positive bowel sounds. Soft, nondistended, though mildly tender in the left lower quadrants. There is some voluntary guarding, but no rebound tenderness. There is no hepatosplenomegaly.  GENITOURINARY: The patient is wearing a diaper, but she is usually continent of urine and stool.  EXTREMITIES: No clubbing, cyanosis, or edema.  SKIN: No rashes. The patient does have some ecchymosis of her right arm. She has a healing hematoma on her left thigh from the same fall in which she hit her head.  MUSCULOSKELETAL: The patient has 5/5 strength in upper and lower extremities bilaterally.  NEUROLOGIC: Cranial nerves II through XII are grossly intact. No dysmmetria. PSYCHIATRIC: Mood is normal. Affect is congruent.   ASSESSMENT AND PLAN: This is an 81 year old female with colitis and leukocytosis.  1.  Colitis presumably bacterial and secondary to constipation; however, there was no cell differential with the complete blood count. Her age would  make lymphocytic colitis part of the differential diagnosis; however, she does not have diarrhea. She has  nonbloody, hard stools. Also, it is unlikely that she would present with IBD at her age. The patient has a benign abdomen on physical examination. There is no evidence of perforation. I have started IV ciprofloxacin and vancomycin to cover enteric anaerobes, gram negatives and gram positives, including MRSA. I have also started the patient on oral Flagyl.  2.  Leukocytosis. The patient is currently afebrile and nonseptic, although she did have some tachypnea when she arrived in the Emergency Department. I have ordered blood cultures and a complete blood count with differential for her morning labs.  3.  Dehydration is moderate to severe. The patient received 1 bolus of normal saline in the Emergency Department, and will continue intravenous fluid at 1-1/2 maintenance rate. She has no history of congestive heart failure.  4.  Constipation. We will aggressively hydrate the patient and start a stool softener. Also, check TSH in the morning.  5.  Urinary tract infection. Urine cultures were not obtained in the Emergency Department. I have ordered them now. She has already gotten 1 dose of ceftriaxone. If culture grows bacteria that is resistant to any of the above-mentioned antibiotics, we will change per sensitivities.  6.  Acute on chronic kidney disease, likely secondary to dehydration. Continue intravenous fluids.  7.  Hyponatremia is very mild. This will improve with intravenous fluid.  8.  Metabolic alkalosis secondary to volume contraction. We will aggressively hydrate the patient.  9.  Increased alkaline phosphatase. May be secondary to increased bone turnover, perhaps due to renal osteodystrophy or other etiology. We will check a vitamin D with morning labs.  10.  Transaminitis. The patient currently has a mildly elevated AST. The etiology of this is unclear. This could possibly be autoimmune.  11.  Interstitial lung disease was visualized on the CT of the abdomen. The patient currently has no  shortness of breath or dyspnea on exertion. She is not on oxygen at home. It is strange that this might be an incidental finding, given the patient is completely asymptomatic. However, it could be related to her liver functions, as this could indicate a rheumatologic or autoimmune process. We will check an antinuclear antibody with morning labs.  12.  Dysphasia, primarily with solids. The patient has had a recent esophageal dilatation for a benign stricture. She also has the patulous areas of her esophagus that could indicate a common source of inflammation that could cause her interstitial lung disease. The patient was initially n.p.o. but we will advance her diet as tolerated.  13.  Elevated troponin. This is minimal. There is no evidence of ischemia on electrocardiogram. It is likely secondary to tachycardia, presumably while the patient was vomiting. Her acute on chronic renal failure may have decreased clearance of this muscle marker. We will still follow enzymes, to ensure there is no significant myocardial damage.  14.  Deep vein thrombosis prophylaxis. Sequential compression devices.  15.  Gastrointestinal prophylaxis. We will continue the patient's proton pump inhibitor.   CODE STATUS: She is a Full Code.   TIME SPENT ON ADMISSION ORDERS AND PATIENT CARE: Approximately 45 minutes.    ____________________________ Kelton Pillar. Sheryle Hail, MD msd:MT D: 02/27/2014 04:18:18 ET T: 02/27/2014 05:25:07 ET JOB#: [id number]  cc: Kelton Pillar. Sheryle Hail, MD, <Dictator> Kelton Pillar Omari Koslosky MD ELECTRONICALLY SIGNED 02/27/2014 7:54

## 2014-11-09 NOTE — H&P (Signed)
PATIENT NAME:  Sophia Bradley, Sophia Bradley MR#:  [id number] DATE OF BIRTH:  04-19-1926  DATE OF ADMISSION:  07/19/2011  PRIMARY CARE PHYSICIAN: Dr. Jerl Mina   CHIEF COMPLAINT: Hypertension.   HISTORY OF PRESENT ILLNESS: Patient is an 81 year old female who presents with chief complaint of elevated blood pressure. Patient was doing okay until three days ago. She did not feel well. Yesterday morning she checked her blood pressure, systolic 210/120. She complained of funny feeling in her head. She denies any headache, dizziness, chest pain or shortness of breath.   PAST MEDICAL/SURGICAL HISTORY:  1. Left leg blood clot. 2. Gastroesophageal reflux disease. 3. Atrial fibrillation. 4. Phlebitis left leg.  5. Hyperlipidemia.  6. PVD. 7. Arthritis. 8. Hypertension. 9. Stent placement in the left leg.  10. Dilatation and curettage. 11. Esophageal dilatation. 12. Appendectomy.  13. Tonsillectomy.  14. Right knee replacement. 15. Left knee surgery.  16. Bilateral cataract surgery.  17. Carotid endarterectomy.   ALLERGIES: Sulfa medications.   CURRENT MEDICATIONS:  1. Zyrtec 10 mg p.o. daily. 2. Vitamin D2 50,000 international units weekly. 3. Tramadol 50 mg, 1 to 2 q.4 hours.  4. ProAir.  5. Pravachol 20 mg p.o. daily.  6. Oxycodone 5 mg 1 to 2 tablets every four p.r.n.  7. Omeprazole 20 mg p.o. daily.  8. Lexapro 40 mg p.o. daily.  9. Levaquin 500 mg p.o. daily.  10. Lasix 20 mg p.o. daily.  11. Diovan 320 mg p.o. daily.  12. Clonidine 0.2 mg p.o. b.i.d.  13. Celexa 20 mg p.o. daily.  14. Bystolic 10 mg p.o. daily.  15. Aspirin 81 mg p.o. daily. 16. Asmanex 14 inhaler daily.   SOCIAL HISTORY: Patient denies tobacco abuse, alcohol abuse or drug abuse.    REVIEW OF SYSTEMS: CONSTITUTIONAL: Patient denies any fevers, chills, night sweats. HEENT: Patient denies any hearing loss, dysphagia, visual problems, sore throat. CARDIOVASCULAR: Patient denies any chest pain, orthopnea, paroxysmal  nocturnal dyspnea. RESPIRATORY: Patient denies any cough, wheezing, or hemoptysis. GASTROINTESTINAL: Patient denies any nausea, vomiting, abdominal pain, hematemesis, hematochezia, melena. GENITOURINARY: Patient denies any hematuria, dysuria, frequency. NEUROLOGIC: Patient denies any headache, focal weakness or seizures. SKIN: Patient denies any lesions, rash. ENDOCRINE: Patient denies polyuria, polyphagia, polydipsia. MUSCULOSKELETAL: Patient denies any arthritis, joint effusion, swelling. HEMATOLOGIC: Patient denies any easy bleeding or bruises.   PHYSICAL EXAMINATION:  VITAL SIGNS: Temperature 96.1, heart rate 54, respiratory rate 18, blood pressure 105/47, oxygen sats 97%.   HEENT: Atraumatic, normocephalic. Pupils are equal, round, and reactive to light and accommodation. Extraocular movements are intact. Sclera anicteric. Mucous membranes moist.    NECK: Supple. No organomegaly.   CARDIOVASCULAR: S1, S2, regular rate, rhythm. No gallops, rubs, thrills or murmurs.   RESPIRATORY: Lungs are clear to auscultation. No rales, no rhonchi, no wheezes, no bronchial breath sounds.   GASTROINTESTINAL: Abdomen is soft, nontender, nondistended. Normal bowel sounds. No hepatosplenomegaly.   GENITOURINARY: There is no hematuria or masses.   SKIN: No lesions, no rash.   ENDOCRINE: No masses. No thyromegaly.   LYMPH: No lymphadenopathy or nodes palpable.   NEUROLOGIC: Cranial nerves II through XII grossly intact. Motor strength is 5/5 bilateral upper and lower extremities. Sensation is within normal limits. No focal neurological deficit noted on examination.   MUSCULOSKELETAL: No arthritis, joint effusion, swelling.  HEMATOLOGIC: No ecchymosis, no bleeding, no petechiae noted.   LABORATORY, DIAGNOSTIC AND RADIOLOGICAL DATA: Electrocardiogram shows sinus rhythm, premature atrial complexes, 61 beats per minute, no acute ST or T wave changes. Troponin 0.06,  WBC count 8100, hemoglobin 12.9, hematocrit  38.8, platelet count 242, MCH 89, glucose 119, BUN 31, creatinine 1.25, sodium 137, potassium 4.4, chloride 102, CO2 23, calcium 9.7, total bilirubin 0.3, alkaline phosphatase 108, ALT 24, AST 34, total protein 8.2, albumin 2.8. Estimated GFR 44. Total CK 149. CK-MB 4.8, troponin 0.06.  ASSESSMENT AND PLAN:  1. Patient is an 81 year old female presents with chief complaint of elevated blood pressure and malignant hypertension. Admit to telemetry. Continue lisinopril, Bystolic, clonidine, Diovan. Add hydralazine, labetalol. 2. Acute renal failure. Monitor renal functions closely.  3. Gastroesophageal reflux disease. Continue omeprazole.  4. Hyperlipidemia. Continue Pravachol. 5. Vitamin D deficiency. Continue vitamin D.  6. Deep vein thrombosis prophylaxis. Lovenox.  7. Mildly elevated troponin. Check serial cardiac enzymes, troponin, echo. Cardiology consultation.   ____________________________ Donia Ast, MD jsp:cms D: 07/20/2011 04:51:17 ET T: 07/20/2011 09:26:49 ET JOB#: [id number]  cc: Donia Ast, MD, <Dictator> Rhona Leavens. Burnett Sheng, MD Donia Ast MD ELECTRONICALLY SIGNED 07/20/2011 21:46

## 2014-11-09 NOTE — Discharge Summary (Signed)
PATIENT NAME:  Sophia Bradley, Sophia Bradley MR#:  [id number] DATE OF BIRTH:  1926-06-11  DATE OF ADMISSION:  07/20/2011 DATE OF DISCHARGE:  07/21/2011  PRESENTING COMPLAINT: Elevated blood pressure.   DISCHARGE DIAGNOSIS: Accelerated hypertension.   CONDITION ON DISCHARGE: Fair. Vitals were stable. Blood pressure was 165/56, saturations were 96% on room air.   DIET: Two gram sodium diet.   MEDICATIONS:  1. Continue quinine as needed for leg cramps.  2. Lasix 20 mg daily.  3. Prilosec over-the-counter 20 mg p.o. daily.  4. Trimethoprim 100 mg at bedtime.  5. Zyrtec 10 mg daily as needed.  6. Tylenol 500 mg 1 to 2 q.4 p.r.n.  7. Aspirin 81 mg daily.  8. Bystolic 10 mg twice a day.  9. Lisinopril 40 mg daily.  10. Vitamin D2 50,000 international units 1 capsule orally once a week.  11. Citalopram 20 mg daily.  12. Pravastatin 20 mg at bedtime.  13. Diovan 320 mg p.o. at bedtime.  14. Clonidine 0.2 mg b.i.d.  15. Nitro-Dur patch 0.4 mg daily. This is a new medicine.  FOLLOW-UP: Follow-up with Dr. Burnett Sheng on 07/22/2011 at 10:45 a.m.   CONSULTATION: Cardiology consultation with Dr. Darrold Junker.   LABORATORY, DIAGNOSTIC, AND RADIOLOGICAL DATA: Echo showed normal LVF, left ventricular hypertrophy, mild AS, mild-to-moderate TR. Cardiac enzymes x3 negative. EKG shows sinus rhythm with PAC and minimal voltage criteria for left ventricular hypertrophy. CBC and comprehensive metabolic panel within normal limits.   BRIEF SUMMARY OF HOSPITAL COURSE: Sophia Bradley is an 81 year old Caucasian female with multiple medical problems who came into the Emergency Room with:   1. Accelerated hypertension. The patient has family history of labile resistant high blood pressure. She is on several medications, however she does report eating unknowingly some salty diet over the holidays. She was continued on her Bystolic, lisinopril, clonidine, Lasix, and losartan. We added a nitroglycerin patch, which she has been  tolerating well with blood pressure very well controlled. P.r.n. labetalol and hydralazine was used in the Emergency Room. Dr. Darrold Junker agrees with the above recommendation and no further cardiac diagnostics were recommended.   2. Elevated troponin, appears to be demand ischemia due to accelerated hypertension. Echo showed no wall motion abnormality. The remaining cardiac enzymes were negative.   3. History of leg cramps and PVD, on quinine.   4. Chronic urinary tract infection. Suppression with trimethoprim at bedtime.   FOLLOW-UP: Follow-up with Dr. Burnett Sheng on January 4th on the scheduled appointment.   TIME SPENT: 40 minutes.   CODE STATUS: The patient remained a FULL CODE.  ____________________________ Wylie Hail. Allena Katz, MD sap:rbg D: 07/21/2011 14:03:32 ET T: 07/22/2011 16:04:27 ET JOB#: [id number]  cc: Shakendra Griffeth A. Allena Katz, MD, <Dictator> Marcina Millard, MD Rhona Leavens. Burnett Sheng, MD Willow Ora MD ELECTRONICALLY SIGNED 08/02/2011 15:24

## 2014-11-09 NOTE — Consult Note (Signed)
PATIENT NAME:  Sophia Bradley, Sophia Bradley MR#:  [id number] DATE OF BIRTH:  07/22/1925  DATE OF CONSULTATION:  07/21/2011  REFERRING PHYSICIAN:  Dr. Allena Katz  CONSULTING PHYSICIAN:  Marcina Millard, MD  CHIEF COMPLAINT: Elevated blood pressure.  HISTORY OF PRESENT ILLNESS: The patient is an 81 year old female with history of hypertension. She was in her usual state of health, was not experiencing symptoms. She checked her blood pressure which was markedly elevated at 210/120. She noted a strange sensation in her forehead but otherwise did not experience chest pain or shortness of breath. She presented to [facility] Emergency Room where she was noted to have elevated blood pressure and was admitted to telemetry. The patient was placed on her usual medications including topical nitrates and blood pressure has responded. The patient continues to deny chest pain or shortness of breath. Upon questioning, the patient has not been adherent to a low sodium diet particularly over the holidays.  PAST MEDICAL HISTORY: 1. Hypertension. 2. Atrial fibrillation. 3. Hyperlipidemia. 4. Peripheral vascular disease. 5. History of stent in left lower extremity. 6. History of DVT. 7. Gastroesophageal reflux disease. 8. History of esophageal dilatation. 9. Status post carotid artery endarterectomy.  MEDICATIONS: 1. Aspirin 81 mg daily. 2. Bystolic 10 mg daily. 3. Clonidine 0.2 mg b.i.d. 4. Diovan 320 mg daily. 5. Lasix 20 mg daily. 6. Pravachol 20 mg daily. 7. Zyrtec 10 mg daily. 8. Vitamin D 50,000 units q. weekly. 9. Tramadol 50 mg 1 to 2 q.4. 10. ProAir. 11. Oxycodone 1 to 2 tabs q.4 p.r.n. 12. Omeprazole 20 mg daily. 13. Lexapro 40 mg daily. 14. Levaquin 500 mg daily. 15. Celexa 20 mg daily. 16. Asmanex inhaler daily.  SOCIAL HISTORY: The patient currently denies tobacco or EtOH abuse.  FAMILY HISTORY: Noncontributory. No immediate family history of coronary artery disease or myocardial infarction.  REVIEW  OF SYSTEMS: CONSTITUTIONAL: No fever or chills. EYES: No blurry vision. EARS: No hearing loss. RESPIRATORY: The patient denies shortness of breath. CARDIOVASCULAR: The patient denies chest pain. GI: The patient denies nausea, vomiting, diarrhea, or constipation. GU: The patient denies dysuria or hematuria. ENDOCRINE: The patient denies polyuria or polydipsia. MUSCULOSKELETAL: The patient denies any arthralgias or myalgias. NEUROLOGIC: The patient denies any focal muscle weakness or numbness.  PHYSICAL EXAMINATION:  VITAL SIGNS: Blood pressure 165/56, pulse 66, respirations 18, temperature 96.4, pulse oximetry 96%.  HEENT: Pupils equal and reactive to light and accommodation.  NECK: Supple without thyromegaly.  LUNGS: Clear.  CARDIOVASCULAR: Normal JVP. Normal PMI. Regular rate and rhythm. Normal S1, S2. No appreciable gallop, murmur, or rub.   ABDOMEN: Soft, nontender. Pulses were intact bilaterally.  MUSCULOSKELETAL: Normal muscle tone.  NEUROLOGIC: The patient was alert and oriented x3. Motor and sensory both grossly intact.   IMPRESSION: This is an 81 year old female with history of hypertension who presents with elevated blood pressure without significant symptoms. Blood pressure is better today.  RECOMMENDATIONS: 1. Agree with overall current therapy. 2. Continue current blood pressure medications. 3. Strongly encouraged patient to adhere to a low sodium diet. Discussed at length with the patient and the patient's family members who she lives with.  4. Will continue to follow-up as an outpatient.  ____________________________ Marcina Millard, MD ap:drc D: 07/21/2011 09:18:36 ET T: 07/21/2011 10:06:35 ET JOB#: [id number]  cc: Marcina Millard, MD, <Dictator> Marcina Millard MD ELECTRONICALLY SIGNED 07/30/2011 10:04

## 2014-11-09 NOTE — Consult Note (Signed)
General Aspect malignant hypertension associated with right renal artery stenosis    Present Illness The patient is a very pleasant 81 year old female with history of labile hypertension, history of peripheral vascular disease and chronic urinary tract infection, who presented to the ER with complaints of elevated blood pressure. She was seen in the Emergency Room and was discharged to go home since her blood pressure was stabilized, however, she came to the ER back because her blood pressure systolic remained in the 854O at home despite taking her four different antihypertensive medicines. The patient denies any chest pain or headache. In the Emergency Room, her blood pressure was 197/86, thereafter it was 212/85. The patient received some IV labetalol. She was recently seen in our office and found to have right renal artery stenosis by ultrasound.  PAST MEDICAL/SURGICAL HISTORY:  1. Labile hypertension.  2. Recent diagnosis of right renal artery stenosis. The patient was seen by PA Corene Cornea at Dr. Bunnie Domino office. Her renal artery duplex showed renal artery stenosis. The exact percentage of stenosis was unable to be determined due to technical difficulty with duplex ultrasound.  3. Hyperlipidemia.  4. Peripheral vascular disease.  5. Arthritis.  6. Phlebitis of left leg.  7. Stent in the left leg in May of 2011.  8. Dilatation and curettage in the past.  9. Esophageal dilatation in 1995.  10. Gastroesophageal reflux disease.  11. Appendectomy.  12. Tonsillectomy.  13. Right knee replacement.  14. Left knee replacement in 2006. 15. Bilateral cataracts in 2005.  16. Appendectomy in 1947.  60. Right carotid endarterectomy.   Home Medications: Medication Instructions Status  furosemide tablet 20 mg 1 tab(s) orally once a day (in the morning) Active  PriLOSEC OTC 20 mg oral delayed release tablet 1 tab(s) orally once a day Active  trimethoprim 100 mg oral tablet 1 tab(s) orally once a day (at  bedtime) Active  quinine 1 cap(s) orally once a day Active  Zyrtec 10 mg oral tablet 1  orally once a day, As Needed Active  acetaminophen 500 mg oral tablet 1 to 2 tab(s) orally every 4 hours as needed for pain or fever Active  aspirin 81 mg oral tablet 1 tab(s) orally once a day Active  Bystolic 10 mg oral tablet 2 tab(s) orally once a day Active  lisinopril 40 mg oral tablet 1 tab(s) orally once a day Active  Vitamin D2 50,000 intl units (1.25 mg) oral capsule 1 cap(s) orally once a week Active  pravastatin 20 mg oral tablet 1 tab(s) orally once a day (at bedtime) Active  clonidine 0.2 mg oral tablet 1 tab(s) orally 2 times a day Active    Sulfa drugs: Rash  Case History:   Family History Non-Contributory    Social History negative tobacco, negative ETOH, negative Illicit drugs   Review of Systems:   Fever/Chills No    Cough No    Sputum No    Abdominal Pain No    Diarrhea No    Constipation No    Nausea/Vomiting No    SOB/DOE No    Chest Pain No    Telemetry Reviewed NSR    Dysuria No   Physical Exam:   GEN well developed, well nourished, no acute distress    HEENT pink conjunctivae, PERRL, hearing intact to voice    NECK supple  trachea midline    RESP normal resp effort  no use of accessory muscles    CARD regular rate  no JVD  ABD denies tenderness  soft    EXTR negative cyanosis/clubbing, positive edema    SKIN No rashes, No ulcers, skin turgor good    NEURO cranial nerves intact, follows commands, motor/sensory function intact    PSYCH alert, A+O to time, place, person   Nursing/Ancillary Notes: **Vital Signs.:   23-May-13 00:33   Vital Signs Type Q 4hr   Temperature Temperature (F) 97.7   Celsius 36.5   Temperature Source oral   Pulse Pulse 106   Pulse source per Dinamap   Respirations Respirations 20   Systolic BP Systolic BP 417   Diastolic BP (mmHg) Diastolic BP (mmHg) 96   Mean BP 127   BP Source Dinamap   Pulse Ox % Pulse  Ox % 93   Pulse Ox Activity Level  At rest   Oxygen Delivery Room Air/ 21 %   Routine Hem:  21-May-13 22:44    WBC (CBC) 8.3   RBC (CBC) 3.75   Hemoglobin (CBC) 11.7   Hematocrit (CBC) 35.0   Platelet Count (CBC) 229   MCV 93   MCH 31.3   MCHC 33.5   RDW 13.7  Routine Chem:  21-May-13 22:44    Glucose, Serum 119   BUN 40   Creatinine (comp) 1.93   Sodium, Serum 138   Potassium, Serum 4.8   Chloride, Serum 105   CO2, Serum 24   Calcium (Total), Serum 8.6  Hepatic:  21-May-13 22:44    Bilirubin, Total 0.2   Alkaline Phosphatase 106   SGPT (ALT) 25   SGOT (AST) 32   Total Protein, Serum 7.3   Albumin, Serum 3.4  Routine Chem:  21-May-13 22:44    Osmolality (calc) 287   eGFR (African American) 27   eGFR (Non-African American) 23   Anion Gap 9  Cardiac:  21-May-13 22:44    Troponin I 0.05  Routine UA:  21-May-13 22:44    Color (UA) Straw   Clarity (UA) Clear   Glucose (UA) Negative   Bilirubin (UA) Negative   Ketones (UA) Negative   Specific Gravity (UA) 1.005   Blood (UA) 1+   pH (UA) 5.0   Protein (UA) 30 mg/dL   Nitrite (UA) Negative   Leukocyte Esterase (UA) Negative   RBC (UA) <1 /HPF   WBC (UA) <1 /HPF   Epithelial Cells (UA) <1 /HPF  Cardiology:  21-May-13 22:46    Ventricular Rate 51   Atrial Rate 56   QRS Duration 78   QT 452   QTc 416   R Axis 0   T Axis 46  Blood Glucose:  21-May-13 22:53    POCT Blood Glucose 123  Routine Chem:  23-May-13 01:39    Glucose, Serum 163   BUN 28   Creatinine (comp) 1.66   Sodium, Serum 139   Potassium, Serum 4.4   Chloride, Serum 105   CO2, Serum 21   Calcium (Total), Serum 9.2   Osmolality (calc) 287   eGFR (African American) 32   eGFR (Non-African American) 28   Anion Gap 13  Cardiac:  23-May-13 01:39    Troponin I 0.26   CK, Total 105   CPK-MB, Serum 3.5     Impression 1.  Right renal artery stenosis         The patient should have a right renal artery intervention.  At this time the  Angio Machine is down.  Dr Lucky Cowboy has cared for her in the past and will be on call  this weekend.  I wil begin mucomyst tomorrow and plan IV hydration per Dr Bunnie Domino schedule. 2.  Malignant hypertension          IV beta blockers as needed          [facility] managing 3.  Gastro esophageal reflux           continue PPI 4.  Hyperlipidemia            continue statin 5.  Peripheral vascular disease           continue antiplatelets and statin    Plan level 4   Electronic Signatures: Hortencia Pilar (MD)  (Signed 31-May-13 16:54)  Authored: General Aspect/Present Illness, Home Medications, Allergies, History and Physical Exam, Vital Signs, Labs, Impression/Plan   Last Updated: 31-May-13 16:54 by Hortencia Pilar (MD)

## 2014-11-09 NOTE — H&P (Signed)
PATIENT NAME:  Sophia Bradley, Sophia Bradley MR#:  [id number] DATE OF BIRTH:  08/07/1925  DATE OF ADMISSION:  12/06/2011  PRIMARY CARE PHYSICIAN: Rhona Leavens. Burnett Sheng, MD   CHIEF COMPLAINT: I have uncontrolled blood pressure.   HISTORY OF PRESENT ILLNESS: Sophia Bradley is a very pleasant 81 year old Caucasian female with history of labile hypertension, history of peripheral vascular disease and chronic urinary tract infection, comes to the Emergency Room with complaints of elevated blood pressure. She was seen in the Emergency Room and was discharged to go home since her blood pressure was stabilized, however, comes back because her blood pressure systolic started staying in the 200s at home despite taking her four different antihypertensive medicines. The patient denies any chest pain or headache. In the Emergency Room this morning when the patient came in, her blood pressure was 197/86, thereafter it was 212/85. The patient received some IV labetalol. She continues to have elevated blood pressure, hence hospitalists were called for further evaluation and management.   PAST MEDICAL/SURGICAL HISTORY:  1. Labile hypertension.  2. Recent diagnosis of right renal artery stenosis. The patient was seen by PA Barbara Cower at Dr. Driscilla Grammes office. Her renal artery duplex showed renal artery stenosis. The exact percentage of stenosis was unable to be determined due to technical difficulty with duplex ultrasound.  3. Hyperlipidemia.  4. Peripheral vascular disease.  5. Arthritis.  6. Phlebitis of left leg.  7. Stent in the left leg in May of 2011.  8. Dilatation and curettage in the past.  9. Esophageal dilatation in 1995.  10. Gastroesophageal reflux disease.  11. Appendectomy.  12. Tonsillectomy.  13. Right knee replacement.  14. Left knee replacement in 2006. 15. Bilateral cataracts in 2005.  16. Appendectomy in 1947.  17. Right carotid endarterectomy.   ALLERGIES: Sulfa.   MEDICATIONS:  1. Tylenol 500 mg, 1 to 2 every  4 hours as needed.  2. Aspirin 81 mg daily.  3. Bystolic 10 mg, 1 tablet b.i.d.  4. Clonidine 0.2 mg, 2 tablets b.i.d.   5. Diovan 320 p.o. at bedtime.  6. Furosemide 20 mg in the morning.  7. Lisinopril 40 mg daily.  8. Pravastatin 20 mg once a day at bedtime.  9. Prilosec 20 mg daily.  10. Quinine 1 tablet daily.  11. Trimethoprim 100 mg at bedtime. This is for her chronic urinary tract infection suppression.  12. Vitamin D2, 50,000 international units once a week. 13.  Zyrtec 10 mg as needed.   FAMILY HISTORY: Positive for hypertension.   SOCIAL HISTORY: She lives at home by herself. She denies any tobacco or alcohol use or any drug use.    REVIEW OF SYSTEMS: CONSTITUTIONAL: No fever, fatigue, weakness. EYES: No blurred or double vision or glaucoma. ENT: No tinnitus, ear pain, hearing loss. RESPIRATORY: No cough, wheeze, hemoptysis. CARDIOVASCULAR: Negative for chest pain. Positive for hypertension. GI: No nausea, vomiting, diarrhea, abdominal pain. Positive for gastroesophageal reflux disease. GENITOURINARY: No dysuria or hematuria. ENDOCRINE: No polyuria, nocturia, or thyroid problems. HEMATOLOGY: No anemia or easy bruising. SKIN: No acne or rash. MUSCULOSKELETAL: Positive for arthritis. NEUROLOGIC: No cerebrovascular accident, transient ischemic attack or any numbness. PSYCHIATRIC: No anxiety or depression. All other systems reviewed and negative.   PHYSICAL EXAMINATION:  GENERAL: The patient is awake, alert, and oriented x3.   VITAL SIGNS: During my evaluation, her pulse is around 67, blood pressure is 169/81, repeat blood pressure is 204/78. Saturations are 96% on room air.   HEENT: Atraumatic, normocephalic. Pupils are equal, round,  and reactive to light and accommodation. Extraocular movements are intact. Oral mucosa is moist.   NECK: Supple, no jugular venous distention, no carotid bruit.   LUNGS: Clear to auscultation bilaterally. No rales, rhonchi, respiratory distress, or  labored breathing.   HEART: Both heart sounds are normal. Rhythm is regular. PMI is not lateralized. Chest is nontender.   EXTREMITIES: Good pedal pulses, good femoral pulses. No lower extremity edema.   ABDOMEN: Soft, benign, and nontender. No organomegaly. No abdominal pulses or mass felt.    NEUROLOGICAL: Grossly intact cranial nerves II through XII. No motor or sensory deficits.   PSYCHIATRIC: The patient is awake, alert, and oriented x3.   SKIN: Warm and dry.   LABORATORY, DIAGNOSTIC AND RADIOLOGICAL DATA:  Hemoglobin and hematocrit 11.7 and 35.0, white count is 8.3, glucose is 119, BUN is 40, creatinine 1.93. The rest of the chemistry is within normal limits.  Troponin is 0.05.  Urinalysis negative for urinary tract infection.  Renal artery duplex done on 11/30/2011 shows elevated velocities in the right renal artery, technically difficult to visualize a plaque. No hemodynamically significant stenosis on the left. Aorta/iliac artery duplex exam shows more than 50% stenosis of the left common iliac artery. Possible mesenteric stenosis with elevated velocities present in the cecal and superior mesenteric arteries.   The patient had an echocardiogram in January of 2013 that showed a normal left ventricular function, left ventricular hypertrophy present, mild AS, mild-to-moderate TR.  EKG shows atrial fibrillation with slow ventricular rate response, however, telemetry strip showed normal sinus rhythm with some premature ventricular contractions.   ASSESSMENT: Sophia Bradley is an 81 year old with: 1. Accelerated hypertension. The patient has been having labile hypertension despite her antihypertensives, felt partly attributed by renal artery stenosis which was recently diagnosed.  2. Renal artery stenosis, right-sided, per recent renal duplex done May 15th with Dr. Driscilla Grammes office.  3. Acute on  chronic renal insufficiency  partly precipitated by hypertensive nephropathy.  4. Hyperlipidemia.   5. History of peripheral vascular disease with more than 50% stenosis of the left common iliac per recent duplex ultrasound.  6. Gastroesophageal reflux disease.   PLAN:  1. Admit the patient on telemetry floor.  2. A 2 grams sodium diet.  3. We will continue all home medications and add Hydralazine 10 mg q.i.d.  4. Heparin for deep vein thrombosis prophylaxis.  5. We will cycle cardiac enzymes x3.  6. We will have Vascular Surgery see the patient regarding her renal artery stenosis and labile hypertension.  7. Further work-up according to the patient's clinical course.   The hospital admission plan was discussed with the patient and niece, who is agreeable to it.   TIME SPENT: 50 minutes.   ____________________________ Wylie Hail Allena Katz, MD sap:cbb D: 12/07/2011 09:47:39 ET T: 12/07/2011 10:18:51 ET JOB#: [id number]  cc: Bridey Brookover A. Allena Katz, MD, <Dictator> Rhona Leavens. Burnett Sheng, MD Annice Needy, MD Willow Ora MD ELECTRONICALLY SIGNED 12/16/2011 22:47

## 2014-11-09 NOTE — Op Note (Signed)
PATIENT NAME:  Sophia Bradley, Sophia Bradley MR#:  [id number] DATE OF BIRTH:  [birth year]  DATE OF PROCEDURE:  12/09/2011  PREOPERATIVE DIAGNOSES:  1. High-grade right renal artery stenosis.  2. Renal vascular hypertension, poorly controlled on multiple medicines.  3. Renal insufficiency.  4. Peripheral vascular disease.   POSTOPERATIVE DIAGNOSES: 1. High-grade right renal artery stenosis.  2. Renal vascular hypertension, poorly controlled on multiple medicines.  3. Renal insufficiency.  4. Peripheral vascular disease.   PROCEDURES:  1. Ultrasound guidance for vascular access, right femoral artery.  2. Catheter placement into right renal artery from right femoral approach.  3. Aortogram and selective right renal angiogram.  4. Balloon expandable stent placement right renal artery with the use of the Emboshield embolic protection device.   SURGEON: Annice Needy, MD   ANESTHESIA: Local with moderate conscious sedation.   ESTIMATED BLOOD LOSS: Approximately 25 mL.  FLUOROSCOPY TIME: Approximately two minutes.   CONTRAST USED: 30 mL.   INDICATION FOR PROCEDURE: This is a female well known to me for previous treatment for peripheral vascular disease. She was admitted with severe poorly controlled hypertension on multiple medications with acute on chronic renal insufficiency. She had known right renal artery stenosis from previous noninvasive studies. At this point she is an excellent candidate for revascularization in hopes of improving her blood pressure and renal function. Risks and benefits were discussed. Informed consent was obtained.   DESCRIPTION OF PROCEDURE: The patient is brought to the Vascular Interventional Radiology Suite. Groins were shaved and prepped and a sterile surgical field was created. The right femoral head was localized with fluoroscopy and the right femoral artery was accessed without difficulty under direct ultrasound guidance with a Seldinger needle. A J-wire and 6 French  sheath was placed. The patient was given 4000 units of intravenous heparin for systemic anticoagulation and 12.5 grams of mannitol. The pigtail catheter was placed in the aorta at the L1 level and AP aortogram was performed. This showed a high-grade right renal artery stenosis, appeared to have quite a mild left renal artery stenosis although the opacification was not great on our initial aortogram. I selectively cannulated the right renal artery with an IM guide catheter and was able to image this and this was a very high-grade lesion of greater than 80%. I crossed the lesion without difficulty with the Medtronic PercuSurge guard wire, inflated this in the distal main renal artery to 6 mm with occlusion seen. A 6 mm diameter x 18 mm length balloon expandable stent was then selected and placed into the right renal artery. The lesion was entirely covered with a tight waste taken which resolved with balloon expandable stent placement. The aspiration catheter was used to aspirate approximately 20 mL of blood and flush it with 20 mL of heparinized saline and the balloon was deflated. Total balloon inflation time was approximately five minutes. Completion angiogram then showed excellent flow through the right renal artery stent with minimal residual stenosis and an nephrogram. At this point I elected to terminate the procedure. The diagnostic guide catheter was removed. Oblique arteriogram was performed of the right femoral artery and StarClose closure device was deployed in the usual fashion with excellent hemostatic result. The patient tolerated the procedure well.  ____________________________ Annice Needy, MD jsd:drc D: 12/09/2011 18:40:09 ET T: 12/10/2011 12:56:21 ET JOB#: [id number]  cc: Annice Needy, MD, <Dictator>  Annice Needy MD ELECTRONICALLY SIGNED 12/14/2011 14:40

## 2014-11-09 NOTE — Discharge Summary (Signed)
PATIENT NAME:  Sophia Bradley, Sophia Bradley MR#:  [id number] DATE OF BIRTH:  Apr 28, 1926  DATE OF ADMISSION:  12/07/2011 DATE OF DISCHARGE:  12/11/2011  HISTORY OF PRESENT ILLNESS: For a detailed note, please take a look at the history and physical done on admission by Dr. Enedina Finner.   DIAGNOSES AT DISCHARGE:  1. Malignant hypertension secondary to a renal artery stenosis.  2. Renal artery stenosis. 3. Acute on chronic renal failure. 4. Gastroesophageal reflux disease.  5. History of recurrent urinary tract infections.   DIET: The patient is being discharged on a low-sodium, low-fat diet.   ACTIVITY: As tolerated.   FOLLOW-UP: Follow-up is with Dr. Tera Mater and Dr. Festus Barren in the next 1 to 2 weeks.   DISCHARGE MEDICATIONS:   1. Lasix 20 mg daily.  2. Prilosec 20 mg daily.  3. Trimethoprim 100 mg daily.  4. Quinine 1 tab daily.  5. Zyrtec 10 mg daily as needed.  6. Tylenol 500 mg 1 to 2 tabs every four hours as needed.  7. Aspirin 81 mg daily.  8. Bystolic 10 mg 2 tabs daily.  9. Lisinopril 40 mg daily.  10. Vitamin D2 50,000 international units weekly.  11. Pravachol 20 mg daily.  12. Clonidine 0.2 mg b.i.d.   STOP MEDICATIONS: The patient is to discontinue taking Diovan.   CONSULTANTS DURING THE HOSPITAL COURSE: Dr. Festus Barren from vascular surgery.   PERTINENT STUDIES: A renal angiogram done on 12/09/2011 due to high-grade renal artery stenosis status post renal artery stent placement.   HOSPITAL COURSE: This is an 81 year old female with multiple medical problems as mentioned above who presented to the hospital on 05/21 secondary to accelerated hypertension despite being on multiple medications.   HOSPITAL COURSE:   1. Accelerated hypertension/malignant hypertension. The patient had significantly elevated blood pressures despite being on multiple antihypertensives. She had been seen by Dr. Wyn Quaker from vascular surgery and was noted to have high-grade renal artery stenosis on the  right. While in the hospital she was maintained on her medications along with some p.r.n. labetalol and hydralazine. The patient underwent renal artery stent angiogram on 05/24. Post angiogram the patient's blood pressures have significantly improved. She had to be taken off one of her antihypertensive medications as her blood pressures came down pretty significantly. For now she will discontinue her Diovan and continue the other regimen as mentioned.   2. Renal artery stenosis. The patient was seen by vascular surgery. She is normally followed by Dr. Wyn Quaker. She underwent a right renal artery angiogram with stent placement on May 24th. She tolerated the procedure well. Post procedure the patient's creatinine did go up a little bit but has come down back to her baseline. Currently her blood pressure is much better controlled and therefore she is being discharged home. She will have follow-up with Dr. Wyn Quaker as an outpatient.   3. Acute on chronic renal failure. The patient's baseline creatinine is 1.4. Because of the renal angiogram, the patient did get some Mucomyst. Her creatinine did bump up to as high as 2.1 and has now come down to 1.6. She will continue taking her maintenance medications for her blood pressure and have her renal function checked as an outpatient.   4. Hyperlipidemia. The patient was maintained on Pravachol and she will resume that.   5. Gastroesophageal reflux disease. The patient was maintained on her Prilosec and she will resume that.   6. History of recurrent UTIs. The patient is on trimethoprim prophylaxis and she  will resume that.   CODE STATUS: The patient is a FULL CODE.   TIME SPENT: 40 minutes.     ____________________________ Rolly Pancake. Cherlynn Kaiser, MD vjs:rbg D: 12/11/2011 13:48:21 ET T: 12/12/2011 13:21:27 ET JOB#: [id number]  cc: Rolly Pancake. Cherlynn Kaiser, MD, <Dictator> Rhona Leavens. Burnett Sheng, MD Annice Needy, MD Houston Siren MD ELECTRONICALLY SIGNED 12/12/2011 15:26

## 2014-11-09 NOTE — Consult Note (Signed)
Brief Consult Note: Diagnosis: malignant hypertension; right renal artery stenosis.   Patient was seen by consultant.   Recommend to proceed with surgery or procedure.   Orders entered.   Comments: The patient should have a right renal artery intervention.  At this time the Angio Machine is down.  Dr Wyn Quaker has cared for her in the past and will be on call this weekend.  I wil begin mucomyst tomorrow and plan IV hydration per Dr Driscilla Grammes schedule.  Electronic Signatures: Levora Dredge (MD)  (Signed 23-May-13 17:39)  Authored: Brief Consult Note   Last Updated: 23-May-13 17:39 by Levora Dredge (MD)

## 2014-11-16 NOTE — H&P (Signed)
PATIENT NAME:  Sophia Bradley, Sophia Bradley MR#:  [id number] DATE OF BIRTH:  05/12/1926  DATE OF ADMISSION:  09/19/2014  PRIMARY CARE PHYSICIAN: Dr. Lannette Donath EMERGENCY DEPARTMENT PHYSICIAN: Dr. Cyril Loosen  CHIEF COMPLAINT: Intractable nausea and vomiting from yesterday and diarrhea started 2 hours ago.   HISTORY OF PRESENT ILLNESS: The patient is an 81 year old Caucasian female presenting to the ED with the chief complaint of intractable nausea and vomiting, started since last night, and diarrhea 2 hours ago. Complaining of epigastric abdominal pain, 6 out of 10. Denies any blood in her vomit or stool. Denies any recent travel. No fevers. No new food trials. No sick contacts. In the ED, sodium was found to be at 128, potassium was at 3.4 CAT scan of the abdomen has revealed dilated loops regarding which the ER physician, Dr. Cyril Loosen, has contacted Dr. Excell Seltzer. Dr. Excell Seltzer does not think it is small bowel obstruction and has recommended abdominal ultrasound, which is pending at this time. During my examination, the patient is having diarrhea, which rules out small bowel obstruction. Daughter is at bedside. No chest pain or shortness of breath. No other complaints. Denies any dizziness or loss of consciousness.   REVIEW OF SYSTEMS:  CONSTITUTIONAL: Denies any fever, fatigue or weight loss. EYES: Denies blurry vision, double vision. No glaucoma. No cataracts.  EARS, NOSE, THROAT: Denies epistaxis. No nasal discharge.  ENDOCRINE: Denies cough, COPD. CARDIOVASCULAR: No chest pain, palpitations.  GASTROINTESTINAL: Complaining of nausea, vomiting, and diarrhea. Denies any hematemesis, melena. Complaining of epigastric abdominal pain, 5/10.  GENITOURINARY: No dysuria or hematuria. Complaining of urinary frequency.  GYN AND BREASTS: Denies breast mass or breast discharge.  ENDOCRINE: Denies polyuria, nocturia, or thyroid problems.  HEMATOLOGIC AND LYMPHATIC: No anemia, easy bruising or bleeding. INTEGUMENTARY:  No acne, rash or lesions. MUSCULOSKELETAL: No joint pain in the neck and back. Denies any gout. NEUROLOGIC: Denies vertigo, ataxia. PSYCHIATRY: No ADD or OCD.  PAST MEDICAL HISTORY: Hypertension, hyperlipidemia, chronic atrial fibrillation, constipation, history of frequent falls.   PAST SURGICAL HISTORY: Bilateral cataract removal, bilateral knee replacement, carotid endarterectomy, appendectomy, pacemaker placement, renal artery stent placement, femoral-popliteal bypass, esophageal stricture dilatation.   ALLERGIES: ADVAIR DISKUS, HYDROCHLOROTHIAZIDE, NORVASC, SULFA.   PSYCHOSOCIAL HISTORY: Lives at home with daughter. No smoking, alcohol or illicit drug usage.   FAMILY HISTORY: Hypertension runs in her family.   HOME MEDICATIONS: Vitamin D 50,000 international units 1 capsule p.o. once a week, pravastatin 20 mg p.o. once daily, omeprazole 20 mg p.o. once a day, furosemide 20 mg 1 tablet p.o. once daily, diltiazem 180 mg 1 capsule p.o. once daily, clonidine 0.2 mg 3 tablets p.o. 2 times a day, Cartia  extended release 180 mg 1 capsule p.o. once daily, Bystolic 10 mg p.o. 2 times a day, aspirin 81 mg p.o. once daily, Tylenol 500 mg 1 to 2 tablets every 4 hours as needed.  PHYSICAL EXAMINATION: VITAL SIGNS: Temperature 97.8, pulse 71, respirations 18, blood pressure 137/54, pulse ox 98%.  GENERAL APPEARANCE: Not in acute distress. Moderately built and nourished.  HEENT: Normocephalic, atraumatic. Pupils are equal and reactive to light and accommodation. No scleral icterus. No conjunctival injection. No sinus tenderness. Dry mucous membranes.  NECK: Supple. No JVD. No thyromegaly. Range of motion is intact.  LUNGS: Clear to auscultation bilaterally. No accessory muscle usage. No anterior chest wall tenderness.  CARDIOVASCULAR: Irregularly irregular. Pacemaker site is intact. Not erythematous.  GASTROINTESTINAL: Soft. Bowel sounds are positive in all 4 quadrants. Hyperactive bowel sounds.  Epigastric abdominal  tenderness is present on palpation. No masses felt.  NEUROLOGIC: Awake, alert, and oriented x3. Cranial nerves II through XII are grossly intact. Motor and sensory are intact. Reflexes are 2+.  EXTREMITIES: No edema. No cyanosis. No clubbing.  SKIN: Warm to touch. Decreased turgor. Dry in nature. No rashes. No lesions.  MUSCULOSKELETAL: No joint effusion, tenderness, erythema.  PSYCHIATRIC: Normal mood and affect.   DIAGNOSTIC DATA: Abdominal ultrasound: Negative study. No cause of right upper quadrant pain by sonography.  CAT scan of the abdomen and pelvis with contrast: Left lower lobe pulmonary nodule, slightly changed from 2013. Atherosclerotic disease. Mild diverticulosis. Mild hiatal hernia with question of reflux. Mildly prominent proximal small bowel loops with no discrete point of obstruction.   Glucose 159, BUN 28, creatinine 1.64, sodium 128, potassium 3.4. GFR 31. Lipase 44. Total protein 8.7, albumin and bilirubin are normal. AST 51, ALT 33. Troponin 0.04. WBC 21,000. Hemoglobin, hematocrit, and platelets are normal. Urinalysis: Yellow in color, cloudy in appearance, nitrites negative, leukocyte esterase 3+. WBC clumps are present. Hyaline casts are present.   A 12-lead EKG shows paced rhythm at 82 beats per minute.   ASSESSMENT AND PLAN: An 81 year old Caucasian female who presented to the ED with the chief complaint of intractable nausea and vomiting since yesterday and epigastric abdominal pain as well as diarrhea for the past 2 to 3 hours. Will be admitted with the following assessment and plan:  1.  Acute gastroenteritis, probably viral. Will admit her to med/surg unit, provide her hydration with IV fluids, antiemetics. Gastrointestinal prophylaxis will be given. We will keep her on clear liquid diet and advance diet as tolerated. We will check stool specimen for stool for Clostridium difficile. Intractable nausea and vomiting can be also from acute cystitis.   2.  Acute cystitis with WBC clumps and leukocytosis. We will get urine culture and sensitivity and the patient will be on intravenous Rocephin and intravenous fluids.  3.  Hyponatremia and hypokalemia. Most likely from intractable nausea and vomiting. We will provide IV fluids with potassium.  4.  Acute epigastric abdominal pain. Probably from acute gastritis from intractable nausea and vomiting as well as from retching.  5.  Acute kidney injury from dehydration from intractable nausea, vomiting, and diarrhea.  6.  History of hypertension. Currently the patient is n.p.o. We will hold off on her home medication.  7.  Hyperlipidemia. Will hold home medications.  8.  Chronic atrial fibrillation, rate controlled at this time. As the patient has intractable nausea, vomiting and diarrhea we are holding the home medication. Once the patient starts tolerating p.o. we can resume her home medications.  9.  We will provide gastrointestinal prophylaxis and deep vein thrombosis prophylaxis with ranitidine and heparin subcutaneous.   CODE STATUS: She is FULL code. Daughter is the medical power of attorney.   Diagnosis and plan of care was discussed in detail with the patient and her daughter at bedside. They both verbalized understanding of the plan.   TOTAL TIME SPENT ON THE ADMISSION: 50 minutes. ____________________________ Ramonita Lab, MD ag:sb D: 09/19/2014 14:10:43 ET T: 09/19/2014 14:54:18 ET JOB#: [id number]  cc: Ramonita Lab, MD, <Dictator> Rhona Leavens. Burnett Sheng, MD Ramonita Lab MD ELECTRONICALLY SIGNED 10/06/2014 22:27

## 2014-11-16 NOTE — Consult Note (Signed)
PATIENT NAME:  Sophia Bradley, Sophia Bradley MR#:  [id number] DATE OF BIRTH:  1925-12-20  DATE OF CONSULTATION:  09/19/2014  CHIEF COMPLAINT: Nausea and vomiting.   HISTORY OF PRESENT ILLNESS: This is an elderly female patient with a history of nausea and vomiting that started yesterday. She states she was fine prior to 3:00 yesterday and has never had an episode like this before. She also points to her epigastrium is an area of mild abdominal pain. She has been treated for a urinary tract infection over the last few days, which she has frequently. She does not have any dysuria, but does not notice a foul odor and color change. Dr.  Cyril Loosen asked to see the patient for a possible bowel obstruction based on some dilated loops of her CT scan, but personal review of her CT and reading of the report fails to identify any clear sign of bowel obstruction. Her only past surgical history is that of an appendectomy. She describes 4 emesis last night, 2 today. No melena or hematochezia. No hematemesis. No fevers or chills. She has also had normal bowel movements yesterday and loose stools today.   PAST MEDICAL HISTORY: She has a pacemaker in place. She has hypertension, hyperlipidemia, atrial fibrillation. She is not anticoagulated.   PAST SURGICAL HISTORY: Appendectomy and knee surgery, carotid endarterectomy as well and multiple esophageal dilatations.   ALLERGIES: MULTIPLE, SEE CHART.   MEDICATIONS: Multiple, see medical reconciliation.   FAMILY HISTORY: No history of colon cancer or abdominal cancers.   REVIEW OF SYSTEMS: A complete system review was performed and negative with the exception of that mentioned in the HPI.   SOCIAL HISTORY: The patient does not smoke or drink.   PHYSICAL EXAMINATION:  GENERAL: Healthy-appearing female patient. She appears comfortable. She is thin, BMI of 27, afebrile, normal heart rate and normal blood pressure.  HEENT: No scleral icterus.  NECK: No palpable neck nodes.  CHEST:  Clear to auscultation.  CARDIAC: Regular rate and rhythm.  ABDOMEN: Soft. Minimally distended, minimally tender in the epigastrium. No guarding, no rebound, no percussion tenderness and no tympany.  EXTREMITIES: Showing minimal edema.  NEUROLOGIC: Grossly intact.  INTEGUMENT: No jaundice.   IMAGING: Personal review of the CT scan she failed to show any sign of bowel obstruction in her bowel. Her small bowel is dilated throughout. There is gas in her colon and rectum. Her stomach is dilated, as is her bladder. An ultrasound fails to identify any abnormalities of the gallbladder.   LABORATORY DATA: Electrolytes are abnormal. She has signs of renal failure with a BUN of 28, creatinine of 1.6. Sodium of 128, potassium at 3.4 and a chloride of 92. She is mildly malnourished with a total protein of 8.7. Lipase is 44, hemoglobin and hematocrit is 13.9 and 43, with a platelet count of 312 a white blood cell count of 210. Urinalysis shows 3+ leukocyte esterase, 1+ blood.   ASSESSMENT AND PLAN: This is a patient with some mild abdominal pain and nausea and vomiting. She has multiple electrolyte abnormalities. She is to be admitted by the medical service. I will and aid in re-examining the patient and considering alternative causes. At this point, I see no sign of a bowel obstruction; however, his stomach is greatly dilated and she may benefit from a nasogastric tube and Foley catheter decompression. She is to be treated for a urinary tract infection, as well.   ____________________________ Adah Salvage. Excell Seltzer, MD rec:ap D: 09/19/2014 15:33:25 ET T: 09/19/2014 16:01:36 ET JOB#:  [id number]  cc: Adah Salvage. Excell Seltzer, MD, <Dictator> Lattie Haw MD ELECTRONICALLY SIGNED 09/19/2014 18:23

## 2014-11-16 NOTE — Consult Note (Signed)
Brief Consult Note: Diagnosis: n/v.   Patient was seen by consultant.   Consult note dictated.   Recommend further assessment or treatment.   Discussed with Attending MD.   Comments: N/V and some RUQ pain rec med admit for ARF, hyponatremia, hypokalemia w/u to include U/S to r/o GB disease as etiol.  Electronic Signatures: Lattie Haw (MD)  (Signed 04-Mar-16 12:16)  Authored: Brief Consult Note   Last Updated: 04-Mar-16 12:16 by Lattie Haw (MD)

## 2014-11-16 NOTE — Discharge Summary (Signed)
PATIENT NAME:  Sophia Bradley, Sophia Bradley MR#:  [id number] DATE OF BIRTH:  29-May-1926  DATE OF ADMISSION:  09/19/2014 DATE OF DISCHARGE:  09/20/2014  DISCHARGE DIAGNOSES:  1. Acute gastroenteritis.  2. Acute kidney injury.  3. Hyponatremia.  4. Hypokalemia.   CONSULTATIONS: Surgery, Dr. Excell Seltzer.   PROCEDURES: None.   BRIEF HISTORY AND PHYSICAL AND HOSPITAL COURSE BASED ON THE PROBLEM: The patient is a an 81 year old female, who came into the ED with a chief complaint of nausea and vomiting for one day and diarrhea for 2 hours. The patient was also complaining of epigastric abdominal pain, 6/10. Denies any hematemesis or melena. Please review history and physical for details. Initial sodium was at 128, potassium at 3.4. CAT scan of the abdomen done in the ED has revealed dilated loops regarding which Emergency Room physician Dr. Cyril Loosen has gone back to on-call surgeon, Dr. Excell Seltzer. Dr. Excell Seltzer does not think the patient has small bowel obstruction and has recommended abdominal ultrasound, which was apparently normal.   HOSPITAL COURSE BASED ON THE PROBLEMS:  1. Acute gastroenteritis. The patient was admitted to the hospital floor. IV fluids were provided. Antiemetics were given. She was started on clear liquid diet and the diet was advanced as tolerated. Stool was negative for Clostridium difficile toxin. By the next day her nausea, vomiting, and diarrhea was completely resolved.  2. Acute cystitis. Initial urea has revealed WBC clumps and leukocytosis. Urine cultures were sent, which was turned out to be negative. The patient was initially given IV Rocephin empirically, which was discontinued as the urine culture was negative.  3. Hyponatremia and hypokalemia. The patient was given IV fluids with potassium. The hyponatremia and the hypokalemia were thought to be from intractable nausea and vomiting. Her sodium and potassium were repleted back with IV fluids with potassium.  4. Intense acute epigastric  abdominal pain, probably from acute gastritis from intractable nausea and vomiting, which was resolved.  5. Acute kidney injury, probably from dehydration, resolved with IV fluids.  6. Chronic atrial fibrillation, rate controlled. Initially, her home medication was held and it was resumed after the patient started tolerating p.o.   Hospital course was uneventful.   CONDITION AT THE TIME OF DISCHARGE: Stable.   PHYSICAL EXAMINATION:  VITAL SIGNS: On 03/05 temperature 97.7, pulse 61, respirations 18, blood pressure and 160/56, pulse oximetry to the 97%.  GENERAL APPEARANCE: Not in acute distress. Moderately built and nourished.  HEENT: Normocephalic, atraumatic. Pupils are equally reacting to light and accommodation. No scleral icterus. No conjunctival injection. No sinus tenderness. No postnasal size that.  NECK: Supple. No JVD. No thyromegaly. Range of motion is intact.  LUNGS: Clear to auscultation bilaterally. No accessory muscle use. No anterior chest wall tenderness on palpation.  CARDIAC: S1, S2 normal. Regular rate and rhythm. No murmurs  GASTROINTESTINAL: Soft. Bowel sounds are positive in all 4 quadrants. Nontender, nondistended. No masses felt.  NEUROLOGIC: Awake irritated and oriented x3. Cranial nerves II through XII are grossly intact. Motor and sensory are intact. Reflexes are 2+.  EXTREMITIES: No cyanosis. No clubbing.  SKIN: Warm to touch. Normal turgor. No rashes. No lesions.  MUSCULOSKELETAL: No joint effusion, tenderness, erythema.  PSYCHIATRIC: Normal mood and affect.   LABORATORY AND IMAGING STUDIES: Glucose 112, BUN, creatinine and sodium are normal, potassium 3.1, which was repleted on 09/20/2014, magnesium 1.9. LFTs: Total protein 5.9, albumin 2.4. The rest of the LFTs are normal. WBC trended down from 21,000 to 6.6 on 09/20/2014. Hemoglobin 10.7, hematocrit 32.8, platelets  are 214,000. Urine culture has revealed mixed bacteria, which is a contaminant. Stool for C. diff  toxin is negative. CAT scan of the abdomen and pelvis with contrast performed 09/19/2014 reveals left lower lobe pulmonary nodules, little change from 2013, small hiatal hernia with mild diverticulosis. Ultrasound of the abdomen limited negative survey. Negative study.  No cause for right upper quadrant pain by sonography.   MEDICATIONS AT THE TIME OF DISCHARGE: Pravastatin 20 mg p.o. at bedtime, furosemide 20 mg 1 tablet p.o. once daily, vitamin D2 50,000 international units 1 capsule p.o. once a week on Wednesdays, Bystolic 10 mg 1 tablet p.o. 2 times a day, aspirin 81 mg p.o. once daily at bedtime, omeprazole 20 mg 1 tablet p.o. once daily, Tylenol 500 mg 1 to 2 tablets every 4 hours as needed for pain or fever, Cartia extended release 180 mg 1 capsule p.o. once daily in the evening, clonidine 0.3 mg 1 tablet p.o. b.i.d.   ACTIVITY: As tolerated.   DIET: Low sodium.   FOLLOWUP: With primary care physician in 1 week. The diagnosis and plan of care was discussed in detail with the patient. She verbalized understanding of the plan.   TOTAL TIME SPENT ON THE DISCHARGE: 45 minutes.   ____________________________ Ramonita Lab, MD ag:ap D: 09/24/2014 23:23:14 ET T: 09/25/2014 08:49:51 ET JOB#: [id number]  cc: Ramonita Lab, MD, <Dictator> Primary Care Physician Ramonita Lab MD ELECTRONICALLY SIGNED 10/06/2014 22:31

## 2015-02-03 ENCOUNTER — Inpatient Hospital Stay
Admission: EM | Admit: 2015-02-03 | Discharge: 2015-02-16 | DRG: 246 | Disposition: E | Payer: Medicare Other | Attending: Internal Medicine | Admitting: Internal Medicine

## 2015-02-03 ENCOUNTER — Encounter: Payer: Self-pay | Admitting: *Deleted

## 2015-02-03 ENCOUNTER — Encounter: Admission: EM | Disposition: E | Payer: Self-pay | Source: Home / Self Care | Attending: Internal Medicine

## 2015-02-03 ENCOUNTER — Emergency Department: Payer: Medicare Other

## 2015-02-03 DIAGNOSIS — I462 Cardiac arrest due to underlying cardiac condition: Secondary | ICD-10-CM | POA: Diagnosis not present

## 2015-02-03 DIAGNOSIS — K7201 Acute and subacute hepatic failure with coma: Secondary | ICD-10-CM | POA: Diagnosis not present

## 2015-02-03 DIAGNOSIS — E872 Acidosis: Secondary | ICD-10-CM | POA: Diagnosis present

## 2015-02-03 DIAGNOSIS — A419 Sepsis, unspecified organism: Secondary | ICD-10-CM

## 2015-02-03 DIAGNOSIS — I2510 Atherosclerotic heart disease of native coronary artery without angina pectoris: Secondary | ICD-10-CM | POA: Diagnosis present

## 2015-02-03 DIAGNOSIS — I219 Acute myocardial infarction, unspecified: Secondary | ICD-10-CM | POA: Diagnosis present

## 2015-02-03 DIAGNOSIS — J810 Acute pulmonary edema: Secondary | ICD-10-CM | POA: Diagnosis not present

## 2015-02-03 DIAGNOSIS — R34 Anuria and oliguria: Secondary | ICD-10-CM | POA: Diagnosis not present

## 2015-02-03 DIAGNOSIS — N179 Acute kidney failure, unspecified: Secondary | ICD-10-CM | POA: Diagnosis not present

## 2015-02-03 DIAGNOSIS — J9601 Acute respiratory failure with hypoxia: Secondary | ICD-10-CM | POA: Diagnosis not present

## 2015-02-03 DIAGNOSIS — R404 Transient alteration of awareness: Secondary | ICD-10-CM | POA: Diagnosis not present

## 2015-02-03 DIAGNOSIS — W19XXXA Unspecified fall, initial encounter: Secondary | ICD-10-CM

## 2015-02-03 DIAGNOSIS — Z515 Encounter for palliative care: Secondary | ICD-10-CM

## 2015-02-03 DIAGNOSIS — R55 Syncope and collapse: Secondary | ICD-10-CM | POA: Diagnosis present

## 2015-02-03 DIAGNOSIS — K921 Melena: Secondary | ICD-10-CM | POA: Diagnosis present

## 2015-02-03 DIAGNOSIS — E782 Mixed hyperlipidemia: Secondary | ICD-10-CM | POA: Diagnosis present

## 2015-02-03 DIAGNOSIS — I213 ST elevation (STEMI) myocardial infarction of unspecified site: Secondary | ICD-10-CM | POA: Diagnosis not present

## 2015-02-03 DIAGNOSIS — S80211A Abrasion, right knee, initial encounter: Secondary | ICD-10-CM | POA: Diagnosis present

## 2015-02-03 DIAGNOSIS — G9340 Encephalopathy, unspecified: Secondary | ICD-10-CM | POA: Diagnosis not present

## 2015-02-03 DIAGNOSIS — Y92093 Driveway of other non-institutional residence as the place of occurrence of the external cause: Secondary | ICD-10-CM

## 2015-02-03 DIAGNOSIS — Z955 Presence of coronary angioplasty implant and graft: Secondary | ICD-10-CM | POA: Diagnosis not present

## 2015-02-03 DIAGNOSIS — Z950 Presence of cardiac pacemaker: Secondary | ICD-10-CM

## 2015-02-03 DIAGNOSIS — K7200 Acute and subacute hepatic failure without coma: Secondary | ICD-10-CM

## 2015-02-03 DIAGNOSIS — Z87440 Personal history of urinary (tract) infections: Secondary | ICD-10-CM | POA: Diagnosis not present

## 2015-02-03 DIAGNOSIS — I959 Hypotension, unspecified: Secondary | ICD-10-CM | POA: Diagnosis not present

## 2015-02-03 DIAGNOSIS — E860 Dehydration: Secondary | ICD-10-CM

## 2015-02-03 DIAGNOSIS — J690 Pneumonitis due to inhalation of food and vomit: Secondary | ICD-10-CM | POA: Diagnosis not present

## 2015-02-03 DIAGNOSIS — I10 Essential (primary) hypertension: Secondary | ICD-10-CM | POA: Diagnosis present

## 2015-02-03 DIAGNOSIS — S60511A Abrasion of right hand, initial encounter: Secondary | ICD-10-CM | POA: Diagnosis present

## 2015-02-03 DIAGNOSIS — Z7982 Long term (current) use of aspirin: Secondary | ICD-10-CM | POA: Diagnosis not present

## 2015-02-03 DIAGNOSIS — Z87891 Personal history of nicotine dependence: Secondary | ICD-10-CM

## 2015-02-03 DIAGNOSIS — R570 Cardiogenic shock: Secondary | ICD-10-CM | POA: Diagnosis not present

## 2015-02-03 DIAGNOSIS — D649 Anemia, unspecified: Secondary | ICD-10-CM | POA: Diagnosis present

## 2015-02-03 DIAGNOSIS — N170 Acute kidney failure with tubular necrosis: Secondary | ICD-10-CM | POA: Diagnosis not present

## 2015-02-03 DIAGNOSIS — I469 Cardiac arrest, cause unspecified: Secondary | ICD-10-CM | POA: Diagnosis not present

## 2015-02-03 DIAGNOSIS — Z79899 Other long term (current) drug therapy: Secondary | ICD-10-CM

## 2015-02-03 DIAGNOSIS — W010XXA Fall on same level from slipping, tripping and stumbling without subsequent striking against object, initial encounter: Secondary | ICD-10-CM | POA: Diagnosis present

## 2015-02-03 DIAGNOSIS — S80212A Abrasion, left knee, initial encounter: Secondary | ICD-10-CM | POA: Diagnosis present

## 2015-02-03 DIAGNOSIS — G9349 Other encephalopathy: Secondary | ICD-10-CM | POA: Diagnosis not present

## 2015-02-03 DIAGNOSIS — T07XXXA Unspecified multiple injuries, initial encounter: Secondary | ICD-10-CM

## 2015-02-03 DIAGNOSIS — I9589 Other hypotension: Secondary | ICD-10-CM | POA: Diagnosis present

## 2015-02-03 DIAGNOSIS — J45909 Unspecified asthma, uncomplicated: Secondary | ICD-10-CM | POA: Diagnosis present

## 2015-02-03 DIAGNOSIS — A4159 Other Gram-negative sepsis: Secondary | ICD-10-CM | POA: Diagnosis not present

## 2015-02-03 DIAGNOSIS — I340 Nonrheumatic mitral (valve) insufficiency: Secondary | ICD-10-CM | POA: Diagnosis present

## 2015-02-03 DIAGNOSIS — I480 Paroxysmal atrial fibrillation: Secondary | ICD-10-CM | POA: Diagnosis present

## 2015-02-03 DIAGNOSIS — Z4659 Encounter for fitting and adjustment of other gastrointestinal appliance and device: Secondary | ICD-10-CM

## 2015-02-03 DIAGNOSIS — I214 Non-ST elevation (NSTEMI) myocardial infarction: Principal | ICD-10-CM | POA: Diagnosis present

## 2015-02-03 DIAGNOSIS — Z66 Do not resuscitate: Secondary | ICD-10-CM | POA: Diagnosis not present

## 2015-02-03 DIAGNOSIS — R092 Respiratory arrest: Secondary | ICD-10-CM | POA: Diagnosis not present

## 2015-02-03 DIAGNOSIS — R0609 Other forms of dyspnea: Secondary | ICD-10-CM

## 2015-02-03 DIAGNOSIS — J9600 Acute respiratory failure, unspecified whether with hypoxia or hypercapnia: Secondary | ICD-10-CM

## 2015-02-03 DIAGNOSIS — R0602 Shortness of breath: Secondary | ICD-10-CM

## 2015-02-03 DIAGNOSIS — R6521 Severe sepsis with septic shock: Secondary | ICD-10-CM | POA: Diagnosis not present

## 2015-02-03 DIAGNOSIS — E162 Hypoglycemia, unspecified: Secondary | ICD-10-CM | POA: Diagnosis present

## 2015-02-03 DIAGNOSIS — I739 Peripheral vascular disease, unspecified: Secondary | ICD-10-CM | POA: Diagnosis present

## 2015-02-03 DIAGNOSIS — K219 Gastro-esophageal reflux disease without esophagitis: Secondary | ICD-10-CM | POA: Diagnosis present

## 2015-02-03 DIAGNOSIS — R0600 Dyspnea, unspecified: Secondary | ICD-10-CM

## 2015-02-03 HISTORY — DX: Unspecified asthma, uncomplicated: J45.909

## 2015-02-03 HISTORY — DX: Essential (primary) hypertension: I10

## 2015-02-03 HISTORY — PX: CARDIAC CATHETERIZATION: SHX172

## 2015-02-03 HISTORY — DX: Atherosclerotic heart disease of native coronary artery without angina pectoris: I25.10

## 2015-02-03 LAB — URINALYSIS COMPLETE WITH MICROSCOPIC (ARMC ONLY)
BILIRUBIN URINE: NEGATIVE
Bacteria, UA: NONE SEEN
GLUCOSE, UA: NEGATIVE mg/dL
Ketones, ur: NEGATIVE mg/dL
Leukocytes, UA: NEGATIVE
NITRITE: NEGATIVE
PROTEIN: NEGATIVE mg/dL
SPECIFIC GRAVITY, URINE: 1.008 (ref 1.005–1.030)
Squamous Epithelial / LPF: NONE SEEN
pH: 7 (ref 5.0–8.0)

## 2015-02-03 LAB — CBC WITH DIFFERENTIAL/PLATELET
BASOS ABS: 0.1 10*3/uL (ref 0–0.1)
Basophils Relative: 1 %
Eosinophils Absolute: 0.1 10*3/uL (ref 0–0.7)
Eosinophils Relative: 1 %
HEMATOCRIT: 35.3 % (ref 35.0–47.0)
HEMOGLOBIN: 11.2 g/dL — AB (ref 12.0–16.0)
LYMPHS PCT: 17 %
Lymphs Abs: 1.1 10*3/uL (ref 1.0–3.6)
MCH: 28.6 pg (ref 26.0–34.0)
MCHC: 31.8 g/dL — AB (ref 32.0–36.0)
MCV: 89.9 fL (ref 80.0–100.0)
Monocytes Absolute: 0.4 10*3/uL (ref 0.2–0.9)
Monocytes Relative: 6 %
Neutro Abs: 5 10*3/uL (ref 1.4–6.5)
Neutrophils Relative %: 75 %
PLATELETS: 251 10*3/uL (ref 150–440)
RBC: 3.93 MIL/uL (ref 3.80–5.20)
RDW: 14.5 % (ref 11.5–14.5)
WBC: 6.7 10*3/uL (ref 3.6–11.0)

## 2015-02-03 LAB — LACTIC ACID, PLASMA: Lactic Acid, Venous: 6.9 mmol/L (ref 0.5–2.0)

## 2015-02-03 LAB — COMPREHENSIVE METABOLIC PANEL
ALBUMIN: 3.2 g/dL — AB (ref 3.5–5.0)
ALT: 84 U/L — ABNORMAL HIGH (ref 14–54)
ANION GAP: 16 — AB (ref 5–15)
AST: 148 U/L — AB (ref 15–41)
Alkaline Phosphatase: 118 U/L (ref 38–126)
BILIRUBIN TOTAL: 0.6 mg/dL (ref 0.3–1.2)
BUN: 22 mg/dL — ABNORMAL HIGH (ref 6–20)
CO2: 19 mmol/L — ABNORMAL LOW (ref 22–32)
Calcium: 8.9 mg/dL (ref 8.9–10.3)
Chloride: 102 mmol/L (ref 101–111)
Creatinine, Ser: 1.46 mg/dL — ABNORMAL HIGH (ref 0.44–1.00)
GFR calc Af Amer: 36 mL/min — ABNORMAL LOW (ref >60)
GFR calc non Af Amer: 31 mL/min — ABNORMAL LOW (ref >60)
GLUCOSE: 174 mg/dL — AB (ref 65–99)
Potassium: 3.8 mmol/L (ref 3.5–5.1)
Sodium: 137 mmol/L (ref 135–145)
TOTAL PROTEIN: 6.5 g/dL (ref 6.5–8.1)

## 2015-02-03 LAB — TROPONIN I
TROPONIN I: 0.03 ng/mL (ref <0.031)
TROPONIN I: 0.18 ng/mL — AB (ref <0.031)
Troponin I: >65 ng/mL — ABNORMAL HIGH (ref <0.031)

## 2015-02-03 LAB — GLUCOSE, CAPILLARY: GLUCOSE-CAPILLARY: 111 mg/dL — AB (ref 65–99)

## 2015-02-03 LAB — CKMB (ARMC ONLY): CK, MB: 5.8 ng/mL — ABNORMAL HIGH (ref 0.5–5.0)

## 2015-02-03 LAB — MRSA PCR SCREENING: MRSA by PCR: NEGATIVE

## 2015-02-03 LAB — CK: CK TOTAL: 80 U/L (ref 38–234)

## 2015-02-03 SURGERY — LEFT HEART CATH
Anesthesia: Moderate Sedation

## 2015-02-03 MED ORDER — SODIUM CHLORIDE 0.9 % WEIGHT BASED INFUSION: 3.0000 mL/kg/h | INTRAVENOUS | Status: AC

## 2015-02-03 MED ORDER — ONDANSETRON HCL 4 MG/2ML IJ SOLN
INTRAMUSCULAR | Status: AC
  Administered 2015-02-03: 4 mg
  Filled 2015-02-03: qty 2

## 2015-02-03 MED ORDER — ASPIRIN EC 81 MG PO TBEC: 81.0000 mg | DELAYED_RELEASE_TABLET | Freq: Every day | ORAL | Status: DC

## 2015-02-03 MED ORDER — SODIUM CHLORIDE 0.9 % IV BOLUS (SEPSIS)
1000.0000 mL | Freq: Once | INTRAVENOUS | Status: AC
  Administered 2015-02-03: 1000 mL via INTRAVENOUS

## 2015-02-03 MED ORDER — ONDANSETRON HCL 4 MG/2ML IJ SOLN
INTRAMUSCULAR | Status: AC
  Administered 2015-02-03: 13:00:00
  Filled 2015-02-03: qty 2

## 2015-02-03 MED ORDER — SODIUM CHLORIDE 0.9 % IJ SOLN: 3.0000 mL | INTRAMUSCULAR | Status: DC | PRN

## 2015-02-03 MED ORDER — TICAGRELOR 90 MG PO TABS
ORAL_TABLET | ORAL | Status: AC
  Filled 2015-02-03: qty 2

## 2015-02-03 MED ORDER — SODIUM CHLORIDE 0.9 % IV SOLN: 250.0000 mL | INTRAVENOUS | Status: DC | PRN

## 2015-02-03 MED ORDER — NOREPINEPHRINE 4 MG/250ML-% IV SOLN
INTRAVENOUS | Status: AC
Start: 2015-02-03 — End: 2015-02-03
  Administered 2015-02-03: 4 ug/min via INTRAVENOUS
  Filled 2015-02-03: qty 250

## 2015-02-03 MED ORDER — VANCOMYCIN HCL IN DEXTROSE 1-5 GM/200ML-% IV SOLN
1000.0000 mg | Freq: Once | INTRAVENOUS | Status: AC
  Administered 2015-02-03: 1000 mg via INTRAVENOUS
  Filled 2015-02-03: qty 200

## 2015-02-03 MED ORDER — BIVALIRUDIN 250 MG IV SOLR
INTRAVENOUS | Status: AC
  Filled 2015-02-03: qty 250

## 2015-02-03 MED ORDER — PRAVASTATIN SODIUM 20 MG PO TABS
40.0000 mg | ORAL_TABLET | Freq: Every day | ORAL | Status: DC
  Administered 2015-02-05 – 2015-02-06 (×2): 40 mg via ORAL
  Filled 2015-02-03: qty 2
  Filled 2015-02-03 (×4): qty 1

## 2015-02-03 MED ORDER — NITROGLYCERIN 5 MG/ML IV SOLN
INTRAVENOUS | Status: AC
  Filled 2015-02-03: qty 10

## 2015-02-03 MED ORDER — NOREPINEPHRINE BITARTRATE 1 MG/ML IV SOLN: 0.0000 ug/min | Freq: Once | INTRAVENOUS | Status: DC

## 2015-02-03 MED ORDER — MIDAZOLAM HCL 2 MG/2ML IJ SOLN
INTRAMUSCULAR | Status: AC
  Filled 2015-02-03: qty 2

## 2015-02-03 MED ORDER — SODIUM CHLORIDE 0.9 % IJ SOLN
3.0000 mL | Freq: Two times a day (BID) | INTRAMUSCULAR | Status: DC
  Administered 2015-02-03: 3 mL via INTRAVENOUS

## 2015-02-03 MED ORDER — TICAGRELOR 90 MG PO TABS: 90.0000 mg | ORAL_TABLET | Freq: Two times a day (BID) | ORAL | Status: DC

## 2015-02-03 MED ORDER — TICAGRELOR 90 MG PO TABS
ORAL_TABLET | ORAL | Status: DC | PRN
  Administered 2015-02-03: 180 mg via ORAL

## 2015-02-03 MED ORDER — SODIUM CHLORIDE 0.9 % IV SOLN
INTRAVENOUS | Status: DC
  Administered 2015-02-03: 21:00:00 via INTRAVENOUS

## 2015-02-03 MED ORDER — SODIUM CHLORIDE 0.9 % IV SOLN
250.0000 mg | INTRAVENOUS | Status: DC | PRN
  Administered 2015-02-03: 1.75 mg/kg/h via INTRAVENOUS

## 2015-02-03 MED ORDER — HEPARIN (PORCINE) IN NACL 2-0.9 UNIT/ML-% IJ SOLN
INTRAMUSCULAR | Status: AC
  Filled 2015-02-03: qty 1000

## 2015-02-03 MED ORDER — VANCOMYCIN HCL IN DEXTROSE 1-5 GM/200ML-% IV SOLN
1000.0000 mg | INTRAVENOUS | Status: DC
  Filled 2015-02-03: qty 200

## 2015-02-03 MED ORDER — ACETAMINOPHEN 325 MG PO TABS
650.0000 mg | ORAL_TABLET | ORAL | Status: DC | PRN
  Administered 2015-02-05: 650 mg via ORAL
  Filled 2015-02-03: qty 2

## 2015-02-03 MED ORDER — PIPERACILLIN-TAZOBACTAM 3.375 G IVPB
3.3750 g | Freq: Once | INTRAVENOUS | Status: AC
  Administered 2015-02-03: 3.375 g via INTRAVENOUS
  Filled 2015-02-03: qty 50

## 2015-02-03 MED ORDER — TIROFIBAN HCL IV 5 MG/100ML
INTRAVENOUS | Status: DC | PRN
  Administered 2015-02-03: 0.075 ug/kg/min via INTRAVENOUS

## 2015-02-03 MED ORDER — ASPIRIN EC 325 MG PO TBEC: 325.0000 mg | DELAYED_RELEASE_TABLET | Freq: Every day | ORAL | Status: DC

## 2015-02-03 MED ORDER — ASPIRIN 81 MG PO CHEW
324.0000 mg | CHEWABLE_TABLET | Freq: Once | ORAL | Status: AC
  Administered 2015-02-03: 324 mg via ORAL
  Filled 2015-02-03: qty 4

## 2015-02-03 MED ORDER — ASPIRIN EC 81 MG PO TBEC
81.0000 mg | DELAYED_RELEASE_TABLET | Freq: Every day | ORAL | Status: DC
  Administered 2015-02-05 – 2015-02-06 (×2): 81 mg via ORAL
  Filled 2015-02-03 (×2): qty 1

## 2015-02-03 MED ORDER — ONDANSETRON HCL 4 MG/2ML IJ SOLN
4.0000 mg | Freq: Four times a day (QID) | INTRAMUSCULAR | Status: DC | PRN
  Administered 2015-02-03 – 2015-02-04 (×3): 4 mg via INTRAVENOUS
  Filled 2015-02-03 (×3): qty 2

## 2015-02-03 MED ORDER — BIVALIRUDIN BOLUS VIA INFUSION - CUPID
INTRAVENOUS | Status: DC | PRN
Start: 2015-02-03 — End: 2015-02-03
  Administered 2015-02-03: 53.25 mg via INTRAVENOUS

## 2015-02-03 MED ORDER — TIROFIBAN (AGGRASTAT) BOLUS VIA INFUSION
INTRAVENOUS | Status: DC | PRN
  Administered 2015-02-03: 1775 ug via INTRAVENOUS

## 2015-02-03 MED ORDER — TICAGRELOR 90 MG PO TABS
90.0000 mg | ORAL_TABLET | Freq: Two times a day (BID) | ORAL | Status: DC
Start: 2015-02-03 — End: 2015-02-07
  Administered 2015-02-03 – 2015-02-06 (×6): 90 mg via ORAL
  Filled 2015-02-03 (×6): qty 1

## 2015-02-03 MED ORDER — NOREPINEPHRINE 4 MG/250ML-% IV SOLN
0.0000 ug/min | INTRAVENOUS | Status: DC
  Administered 2015-02-03: 4 ug/min via INTRAVENOUS
  Administered 2015-02-04 (×2): 45 ug/min via INTRAVENOUS
  Filled 2015-02-03 (×3): qty 250

## 2015-02-03 MED ORDER — TIROFIBAN HCL IV 5 MG/100ML
0.1500 ug/kg/min | INTRAVENOUS | Status: DC
  Administered 2015-02-03: 0.15 ug/kg/min via INTRAVENOUS
  Filled 2015-02-03: qty 100

## 2015-02-03 MED ORDER — PIPERACILLIN-TAZOBACTAM 3.375 G IVPB
3.3750 g | Freq: Three times a day (TID) | INTRAVENOUS | Status: DC
  Administered 2015-02-03 – 2015-02-04 (×2): 3.375 g via INTRAVENOUS
  Filled 2015-02-03 (×3): qty 50

## 2015-02-03 MED ORDER — VANCOMYCIN HCL IN DEXTROSE 750-5 MG/150ML-% IV SOLN
750.0000 mg | INTRAVENOUS | Status: DC
  Administered 2015-02-03: 750 mg via INTRAVENOUS
  Filled 2015-02-03 (×2): qty 150

## 2015-02-03 MED ORDER — NITROGLYCERIN 0.4 MG SL SUBL: 0.4000 mg | SUBLINGUAL_TABLET | SUBLINGUAL | Status: DC | PRN

## 2015-02-03 SURGICAL SUPPLY — 20 items
BALLN ~~LOC~~ TREK RX 2.5X12 (BALLOONS) ×3
BALLN ~~LOC~~ TREK RX 3.5X12 (BALLOONS) ×3
BALLOON ~~LOC~~ TREK RX 2.5X12 (BALLOONS) ×1 IMPLANT
BALLOON ~~LOC~~ TREK RX 3.5X12 (BALLOONS) ×1 IMPLANT
CATH INFINITI 5 FR 3DRC (CATHETERS) ×3 IMPLANT
CATH INFINITI 5FR ANG PIGTAIL (CATHETERS) ×3 IMPLANT
CATH INFINITI 5FR JL4 (CATHETERS) ×3 IMPLANT
CATH INFINITI JR4 5F (CATHETERS) ×3 IMPLANT
CATH VISTA GUIDE 6FR 3DRC (CATHETERS) ×3 IMPLANT
CATH VISTA GUIDE 6FR JR4 SH (CATHETERS) ×3 IMPLANT
DEVICE CLOSURE MYNXGRIP 6/7F (Vascular Products) ×3 IMPLANT
DEVICE INFLAT 30 PLUS (MISCELLANEOUS) ×3 IMPLANT
KIT MANI 3VAL PERCEP (MISCELLANEOUS) ×3 IMPLANT
NEEDLE PERC 18GX7CM (NEEDLE) ×3 IMPLANT
PACK CARDIAC CATH (CUSTOM PROCEDURE TRAY) ×3 IMPLANT
SHEATH AVANTI 6FR X 11CM (SHEATH) ×3 IMPLANT
SHEATH PINNACLE 5F 10CM (SHEATH) ×3 IMPLANT
STENT XIENCE ALPINE RX 3.25X15 (Permanent Stent) ×3 IMPLANT
WIRE EMERALD 3MM-J .035X150CM (WIRE) ×3 IMPLANT
WIRE HI TORQ TRAVERSE ST 190CM (WIRE) ×3 IMPLANT

## 2015-02-03 NOTE — Progress Notes (Signed)
Spoke with Dr. Gwen Pounds for clarification on Aggrastat  to restart until the AM.he was also informed of pt's labs of elevated troponin , Lactic acid and increase HR of 130's.with activity.

## 2015-02-03 NOTE — Consult Note (Signed)
[facility] Cardiology Consultation Note  Patient ID: Sophia Bradley, MRN: [id number], DOB/AGE: 1926/04/11 81 y.o. Admit date: 2015-03-01   Date of Consult: 03/01/15 Primary Physician: Jerl Mina, MD Primary Cardiologist: Jennette Dubin  Chief Complaint:  Chief Complaint  Patient presents with  . Shortness of Breath   Reason for Consult: acute chest discomfort with elevated troponin non-ST elevation myocardial infarction  HPI: 81 y.o. female with known coronary artery disease status post previous PCI and stent placement in the past with known heart block status post pacemaker placement with hypertension hyperlipidemia on appropriate medication management when she had an acute onset of chest discomfort nausea vomiting and other issues causing some dizziness and hypotension. When she was seen in the emergency room she had an EKG showing atrial fibrillation with controlled ventricular rate as well as pacemaker. The patient did improve with some hydration but still had some nausea and some discomfort most consistent with possible myocardial infarction with an elevated troponin of 0.18 consistent with non-ST elevation myocardial infarction. The patient has had a reasonable hypertension hyperlipidemia control with medications listed below and not been on anticoagulation for her atrial fibrillation. Therefore we will further evaluate for the possibility of acute myocardial infarction  Past Medical History  Diagnosis Date  . Hypertension   . Asthma   . Coronary artery disease       Surgical History:  Past Surgical History  Procedure Laterality Date  . Pacemaker insertion    . Kidney stent    . Coronary angioplasty with stent placement       Home Meds: Prior to Admission medications   Medication Sig Start Date End Date Taking? Authorizing Provider  aspirin EC 81 MG tablet Take 81 mg by mouth daily.   Yes Historical Provider, MD  cloNIDine (CATAPRES) 0.2 MG tablet Take 0.6 mg by mouth  3 (three) times daily.   Yes Historical Provider, MD  diltiazem (CARDIZEM CD) 180 MG 24 hr capsule Take 180 mg by mouth daily.   Yes Historical Provider, MD  furosemide (LASIX) 20 MG tablet Take 20 mg by mouth daily.   Yes Historical Provider, MD  nebivolol (BYSTOLIC) 10 MG tablet Take 10 mg by mouth daily.   Yes Historical Provider, MD  omeprazole (PRILOSEC) 20 MG capsule Take 20 mg by mouth daily.   Yes Historical Provider, MD  pravastatin (PRAVACHOL) 40 MG tablet Take 40 mg by mouth daily.   Yes Historical Provider, MD  Vitamin D, Ergocalciferol, (DRISDOL) 50000 UNITS CAPS capsule Take 50,000 Units by mouth every 7 (seven) days.   Yes Historical Provider, MD    Inpatient Medications:  . [MAR Hold] aspirin EC  81 mg Oral Daily  . piperacillin-tazobactam (ZOSYN)  IV  3.375 g Intravenous Once  . [MAR Hold] pravastatin  40 mg Oral Daily  . [MAR Hold] vancomycin  1,000 mg Intravenous Q24H   . sodium chloride    . [MAR Hold] norepinephrine Stopped (2015-03-01 1255)    Allergies:  Allergies  Allergen Reactions  . Norvasc [Amlodipine Besylate] Swelling  . Vesicare [Solifenacin] Swelling  . Advair Diskus [Fluticasone-Salmeterol] Rash  . Sulfa Antibiotics Rash    History   Social History  . Marital Status: Widowed    Spouse Name: N/A  . Number of Children: N/A  . Years of Education: N/A   Occupational History  . Not on file.   Social History Main Topics  . Smoking status: Former Games developer  . Smokeless tobacco: Not on file  . Alcohol  Use: Yes  . Drug Use: Not on file  . Sexual Activity: Not on file   Other Topics Concern  . Not on file   Social History Narrative  . No narrative on file     No family history on file.   Review of Systems Positive for chest pain shortness of breath Negative for: General:  chills, fever, night sweats or weight changes.  Cardiovascular: PND orthopnea syncope dizziness  Dermatological skin lesions rashes Respiratory: Cough  congestion Urologic: Frequent urination urination at night and hematuria Abdominal: negative for   diarrhea, bright red blood per rectum, melena, or hematemesis Neurologic: negative for visual changes, and/or hearing changes  All other systems reviewed and are otherwise negative except as noted above.  Labs:  Recent Labs  02/15/15 1157 02/15/15 1331  CKTOTAL 80  --   CKMB  --  5.8*  TROPONINI 0.03 0.18*   Lab Results  Component Value Date   WBC 6.7 15-Feb-2015   HGB 11.2* 15-Feb-2015   HCT 35.3 2015/02/15   MCV 89.9 15-Feb-2015   PLT 251 Feb 15, 2015    Recent Labs Lab Feb 15, 2015 1157  NA 137  K 3.8  CL 102  CO2 19*  BUN 22*  CREATININE 1.46*  CALCIUM 8.9  PROT 6.5  BILITOT 0.6  ALKPHOS 118  ALT 84*  AST 148*  GLUCOSE 174*   Lab Results  Component Value Date   CHOL 194 05/27/2012   HDL 66* 05/27/2012   LDLCALC 114* 05/27/2012   TRIG 71 05/27/2012   No results found for: DDIMER  Radiology/Studies:  Dg Chest 1 View  02/15/2015   CLINICAL DATA:  Shortness of breath, near syncope  EXAM: CHEST  1 VIEW  COMPARISON:  Portable exam 1232 hours compared to 10/10/2012  FINDINGS: LEFT subclavian transvenous pacemaker with leads projecting over RIGHT atrium and RIGHT ventricle, unchanged.  Borderline enlargement of cardiac silhouette.  Atherosclerotic calcification aorta.  Mediastinal contours and pulmonary vascularity normal.  Lungs clear.  No pleural effusion or pneumothorax.  Bones demineralized with probable BILATERAL chronic rotator cuff tears.  Surgical clips in the RIGHT cervical region.  IMPRESSION: No acute abnormalities.   Electronically Signed   By: Ulyses Southward M.D.   On: 02-15-15 12:43    EKG: atrial fibrillation with ventricular pacing  Weights: Filed Weights   2015/02/15 1236  Weight: 156 lb 8.4 oz (71 kg)     Physical Exam: Blood pressure 89/48, pulse 63, temperature 97.6 F (36.4 C), resp. rate 12, weight 156 lb 8.4 oz (71 kg), SpO2 96 %. There is no  height on file to calculate BMI. General: Well developed, well nourished, in no acute distress. Head eyes ears nose throat: Normocephalic, atraumatic, sclera non-icteric, no xanthomas, nares are without discharge. No apparent thyromegaly and/or mass  Lungs: Normal respiratory effort.  fewwheezes, basilar rales, no rhonchi.  Heart: RRR with normal S1 S2. no murmur gallop, no rub, PMI is normal size and placement, carotid upstroke normal without bruit, jugular venous pressure is normal Abdomen: Soft, non-tender, non-distended with normoactive bowel sounds. No hepatomegaly. No rebound/guarding. No obvious abdominal masses. Abdominal aorta is normal size without bruit Extremities: trace edema. no cyanosis, no clubbing, no ulcers  Peripheral : 2+ bilateral upper extremity pulses, 2+ bilateral femoral pulses, 2+ bilateral dorsal pedal pulse Neuro: Alert and oriented. No facial asymmetry. No focal deficit. Moves all extremities spontaneously. Musculoskeletal: Normal muscle tone with  kyphosis Psych:  Responds to questions appropriately with a normal affect.    Assessment:  81 year old female with known coronary artery disease status post previous PCI and stent placement 6 sinus syndrome status post pacemaker placement and she'll hypertension mixed hyperlipidemia with a non-ST elevation myocardial infarction  Plan: 1. Proceed to cardiac catheterization to assess coronary anatomy and further treatment thereof is necessary. Patient understands risk and benefits of cardiac catheterization. This includes possibility of death stroke heart attack infection bleeding or blood clot. She is at low risk for conscious sedation 2. Further treatment options after above  Signed, Lamar Blinks M.D. [facility] Cardiology 02-04-2015, 3:45 PM

## 2015-02-03 NOTE — Progress Notes (Signed)
Right groin remains uncompromised/site soft/no bleeding.  DP pulses faint but palpable.

## 2015-02-03 NOTE — ED Notes (Addendum)
Went outside and on her way back to the house developed sob, neaarsyncope slid down wall. Has abrasions on knees and right hand

## 2015-02-03 NOTE — ED Notes (Signed)
Was told pt to have cardiac cath today

## 2015-02-03 NOTE — ED Notes (Signed)
Pt had 2 soft bm,, adm md with pt

## 2015-02-03 NOTE — ED Provider Notes (Signed)
----------------------------------------- 12:00 PM on 02/10/2015 -----------------------------------------  I have paged out to the interventional cardiology at 1158 to discuss the patient's ECG which I'm concerned could represent acute ST elevation MI due to inferior ST elevations even in the setting of ventricular paced rhythm.  Discussed with Dr. Cassie Freer concerns for inferior elevation with v-paced rhythm. Given paced rhythm Dr. Cassie Freer recommends the patient would not go directly for catheterization, and that we should of course check troponins. He has requested that I have Dr. Gwen Pounds come and see and evaluate the patient and the ER for further cardiology consultation and possible need to have pacemaker interrogated.  Patient does describe mild chest pressure, and she is notably hypotensive which I discussed with Dr. Cassie Freer as well. At this point, I'm in agreement that we cannot definitively say this patient is having an ST elevation MI and other etiology such as dehydration, syncope, arrhythmia, etc. are possible. The patient Dr. Gwen Pounds for cardiology consultation. We'll continue fluid resuscitation here in the ER.  ----------------------------------------- 12:14 PM on 01/25/2015 -----------------------------------------  Discussed with Dr. Gwen Pounds who is coming to see the patient in the ER for emergent evaluation regarding hypotension in the setting of her V paced rhythm.  Patient's blood pressure is dropped down to 75 systolic, she is awake alert and talking. She states she has some tightness in her chest however this is improving. Otherwise she just has a slight nausea but no other pain. She is awake and alert with a stable neurologic exam with no obvious evidence of stroke. My primary concern at this point would be acute cardiac etiology, other considerations may be something like dissection but really no history of support aneurysm rupture. She is not hypertensive like odor expect  with a dissection. She has no evidence of acute stroke. We will continue to hydrate aggressively, check stool for possible blood in case this could represent something like a GI bleed. Continue to await labs and continue resuscitation.  [facility] Emergency Department Provider Note  ____________________________________________  Time seen: Approximately 11:50 AM  I have reviewed the triage vital signs and the nursing notes.   HISTORY  Chief Complaint Almost passed out.  EM caveat  History and exam are  limited by the patient's acuity of presentation and severe hypotension requiring immediate evaluation for medical stabilizing treatments.   HPI MELEANA ENRIGHT is a 81 y.o. female who is at home in her normal state of health, while walking through the driveway she said she started feeling short of breath lightheaded and developed chest tightness and then had a fall to her knees. She denies injury other than some abrasions and "scraping" her knees but she feels very very weak and lightheaded. She is not dizzy. Note numbness or tingling. No weakness in arm or leg. No facial droop. No headache. She states she has some slight tightness across the front of the chest and nausea. No abdominal pain.  She does report a recent urinary tract infection which was treated but she reports her symptoms improved and she last using antibiotic for roughly a week ago.  Tightness and chest is described as improving, is nonradiating. It has not moved. No sharp or tearing pain.  Past Medical History  Diagnosis Date  . Hypertension   . Asthma   . Coronary artery disease     There are no active problems to display for this patient.   Past Surgical History  Procedure Laterality Date  . Pacemaker insertion    .  Kidney stent    . Coronary angioplasty with stent placement      No current outpatient prescriptions on file.  Allergies Norvasc; Vesicare; Advair diskus; and Sulfa  antibiotics  No family history on file.  Social History History  Substance Use Topics  . Smoking status: Former Games developer  . Smokeless tobacco: Not on file  . Alcohol Use: Yes    Review of Systems Constitutional: No fever/chills Eyes: No visual changes. ENT: No sore throat. Cardiovascular: See history of present illness Respiratory: Denies shortness of breath now, but did have moderate shortness of breath while walking just prior and during this episode. Gastrointestinal: No abdominal pain.  No  vomiting.  No diarrhea.  No constipation. No black or bloody stools. She denies having any history of any medical problem related to bleeding. Genitourinary: Negative for dysuria. Musculoskeletal: Negative for back pain. Skin: Negative for rash. Neurological: Negative for headaches, focal weakness or numbness.  10-point ROS otherwise negative.  ____________________________________________   PHYSICAL EXAM:  VITAL SIGNS: Filed Vitals:   02/09/2015 1236  BP: 79/42  Pulse: 67  Temp: 97.6 F (36.4 C)  Resp: 24                                                            --     Constitutional: Alert and oriented. Subtly diaphoretic and pale fatigued appearing but no "distressed". Eyes: Conjunctivae are normal. PERRL. EOMI. Head: Atraumatic. Nose: No congestion/rhinnorhea. Mouth/Throat: Mucous membranes are moist.  Oropharynx non-erythematous. Neck: No stridor.   Cardiovascular: Normal rate, regular rhythm. Grossly normal heart sounds.  Slightly cool with peripheral pallor in all extremities. Respiratory: Normal respiratory effort.  No retractions. Lungs CTAB. Gastrointestinal: Soft and nontender. No distention. No abdominal bruits. No CVA tenderness. Musculoskeletal: No lower extremity tenderness nor edema.  No joint effusions. Neurologic:  Normal speech and language. No gross focal neurologic deficits are appreciated. Extraocular movements normal. No visual field cuts.  Moves all extremities with 4-5 strength without deficit. Normal sensory exam in all extremities. No pronator drift.  Rectal exam performed and brown guaiac negative stool. Escorted by nurse. Skin:  Skin is warm, dry and intact. No rash noted. Psychiatric: Mood and affect are normal. Speech and behavior are normal.  ____________________________________________   LABS (all labs ordered are listed, but only abnormal results are displayed)  Labs Reviewed  CBC WITH DIFFERENTIAL/PLATELET - Abnormal; Notable for the following:    Hemoglobin 11.2 (*)    MCHC 31.8 (*)    All other components within normal limits  COMPREHENSIVE METABOLIC PANEL - Abnormal; Notable for the following:    CO2 19 (*)    Glucose, Bld 174 (*)    BUN 22 (*)    Creatinine, Ser 1.46 (*)    Albumin 3.2 (*)    AST 148 (*)    ALT 84 (*)    GFR calc non Af Amer 31 (*)    GFR calc Af Amer 36 (*)    Anion gap 16 (*)    All other components within normal limits  CULTURE, BLOOD (ROUTINE X 2)  CULTURE, BLOOD (ROUTINE X 2)  TROPONIN I  CK  LACTIC ACID, PLASMA  LACTIC ACID, PLASMA  URINALYSIS COMPLETEWITH MICROSCOPIC ([facility] ONLY)   ____________________________________________  EKG  Reviewed and interpreted by me. Also  review the bedside by Drs. Kowalski in [place].  EKG demonstrates ventricularly paced rhythm at a rate of 70. There is ST elevation noted in leads 23 and aVF which could be indicative of acute ST elevation MI, however this may also be indicative of ventricular paced rhythm.  QRS 168 QTc 520 Interpreted as ventricular paced rhythm. ____________________________________________  RADIOLOGY  DG Chest 1 View (Final result) Result time: March 05, 2015 12:43:18   Final result by Rad Results In Interface (03-05-2015 12:43:18)   Narrative:   CLINICAL DATA: Shortness of breath, near syncope  EXAM: CHEST 1 VIEW  COMPARISON: Portable exam 1232 hours compared to 10/10/2012  FINDINGS: LEFT subclavian  transvenous pacemaker with leads projecting over RIGHT atrium and RIGHT ventricle, unchanged.  Borderline enlargement of cardiac silhouette.  Atherosclerotic calcification aorta.  Mediastinal contours and pulmonary vascularity normal.  Lungs clear.  No pleural effusion or pneumothorax.  Bones demineralized with probable BILATERAL chronic rotator cuff tears.  Surgical clips in the RIGHT cervical region.  IMPRESSION: No acute abnormalities.    ____________________________________________   PROCEDURES  Procedure(s) performed: None  Critical Care performed: Yes, see critical care note(s)  CRITICAL CARE Performed by: Sharyn Creamer   Total critical care time: 60  Critical care time was exclusive of separately billable procedures and treating other patients.  Critical care was necessary to treat or prevent imminent or life-threatening deterioration.  Critical care was time spent personally by me on the following activities: development of treatment plan with patient and/or surrogate as well as nursing, discussions with consultants, evaluation of patient's response to treatment, examination of patient, obtaining history from patient or surrogate, ordering and performing treatments and interventions, ordering and review of laboratory studies, ordering and review of radiographic studies, pulse oximetry and re-evaluation of patient's condition.  Patient presents with acute hypotension, symptomatic. Blood pressure less than 80 systolic. Hemodynamically she is very unstable and we're continuing fluid resuscitation at this time.  ____________________________________________   INITIAL IMPRESSION / ASSESSMENT AND PLAN / ED COURSE  Pertinent labs & imaging results that were available during my care of the patient were reviewed by me and considered in my medical decision making (see chart for details).  Patient presents with sudden onset of weakness, hypotension, and chest tightness.  She did fall and scraped her knees and had to "pull herself up" at the side of the car when she called 911. I am concerned that she could have an acute cardiopulmonary event, or other acute etiology such as gastrointestinal bleeding, sudden anemia, less likely would be something such as acute stroke though her neurologic exam is very much intact and I push is further down her differential as we continue to rule out other etiologies. With her severe hypotension at present I do not feel she is currently stable to go to CT rather we'll work to resuscitate her here in the emergency room. I discussed with her family and her family, she is full code.  The stone on reexamination the patient does have some minor abrasions to both knees and to the right hand. No evidence of long bone fracture or bony tenderness.  ----------------------------------------- 1:05 PM on 05-Mar-2015 -----------------------------------------  Patient is awake and alert. She denies any pain or nausea and states she feels much better. Although it is possible this is prerenal and represents dehydration, differential diagnosis would certainly still include cardiac disease, other acute cardiopulmonary etiology, sepsis. Her GFR is marginal and I would be hesitant to perform CT imaging of the chest to rule out  etiologies such as pulmonary embolism at this time. Discussed with Dr. Luberta Mutter. Will admit the patient to the hospitalist service. Some labs including urinalysis are still pending at this time. Patient appears to be stabilized is off levophed.   Given the patient's report of a recent UTI, I have empirically started treatment for bacteremia/sepsis in the event that the patient should in the past and positive blood cultures given the profound hypotension and presentation she exhibited today.  Updated patient and family. ____________________________________________   FINAL CLINICAL IMPRESSION(S) / ED DIAGNOSES  Final diagnoses:   Hypotension, unspecified hypotension type  Dehydration  Exertional dyspnea  Abrasions of multiple sites        Sharyn Creamer, MD 02/27/15 1308

## 2015-02-03 NOTE — OR Nursing (Signed)
Vomiting. bp dropped sbp 80's x 2

## 2015-02-03 NOTE — ED Notes (Signed)
Non adherent dsy to abrasions on knees and right hand

## 2015-02-03 NOTE — H&P (Signed)
[facility] - [place] at [facility]   PATIENT NAME: Sophia Bradley    MR#:  [id number]  DATE OF BIRTH:  1925/11/12  DATE OF ADMISSION:  Feb 15, 2015  PRIMARY CARE PHYSICIAN: Jerl Mina, MD   REQUESTING/REFERRING PHYSICIAN: Dr. Fanny Bien  CHIEF COMPLAINT: Chest pressure and nausea, shortness of breath    Chief Complaint  Patient presents with  . Shortness of Breath    HISTORY OF PRESENT ILLNESS:  Sophia Bradley  is a 81 y.o. female with a known history of hypertension, peripheral vascular disease, coronary artery disease comes in with shortness of breath started suddenly today morning. Patient also noticed chest tightness midsternal chest tightness started today morning as well. Says that chest tightness goes to the right arm. Also has some persistent nausea since this morning. Feels very uncomfortable because of persistent nausea also was hypotensive with blood pressure 83/73 on arrival. Patient received about 2 L of IV fluids bp is  still fluctuating between now systolic 90s&100s. Patient EKG showed ST elevations in inferior leads and the ER physician contacted Dr. Ann Maki O's who came and saw the patient and the beta was thought that patient's rhythm is consistent with paced rhythm rather than ST elevations. However patient continues to have nausea hypotension and the the repeat troponins showed a troponin elevated up to 0.18. So I did call Dr. Gwen Pounds and the patient now will be taken to cardiac catheter, and the she'll be admitted to intensive care unit for meanwhile. Regarding anticoagulation patient will be on aspirin and IV intensity statin. Cannot be a beta blocker secondary to hypotension. A shunt or treated for UTI recently. PAST MEDICAL HISTORY:   Past Medical History  Diagnosis Date  . Hypertension   . Asthma   . Coronary artery disease     PAST SURGICAL HISTOIRY:   Past Surgical History  Procedure Laterality Date  . Pacemaker insertion    .  Kidney stent    . Coronary angioplasty with stent placement      SOCIAL HISTORY:   History  Substance Use Topics  . Smoking status: Former Games developer  . Smokeless tobacco: Not on file  . Alcohol Use: Yes    FAMILY HISTORY:  No family history on file.  DRUG ALLERGIES:   Allergies  Allergen Reactions  . Norvasc [Amlodipine Besylate] Swelling  . Vesicare [Solifenacin] Swelling  . Advair Diskus [Fluticasone-Salmeterol] Rash  . Sulfa Antibiotics Rash    REVIEW OF SYSTEMS:  CONSTITUTIONAL: No fever, fatigue or weakness.  EYES: No blurred or double vision.  EARS, NOSE, AND THROAT: No tinnitus or ear pain.  RESPIRATORY: No cough, shortness of breath, wheezing or hemoptysis.  CARDIOVASCULAR: No chest pain, orthopnea, edema.  GASTROINTESTINAL: Persistent nausea present no diarrhea no abdominal pain. GENITOURINARY: No dysuria, hematuria.  ENDOCRINE: No polyuria, nocturia,  HEMATOLOGY: No anemia, easy bruising or bleeding SKIN: No rash or lesion. MUSCULOSKELETAL: Has bilateral extremity edema 1+ NEUROLOGIC: No tingling, numbness, weakness.  PSYCHIATRY: No anxiety or depression.   MEDICATIONS AT HOME:   Prior to Admission medications   Medication Sig Start Date End Date Taking? Authorizing Provider  aspirin EC 81 MG tablet Take 81 mg by mouth daily.   Yes Historical Provider, MD  cloNIDine (CATAPRES) 0.2 MG tablet Take 0.6 mg by mouth 3 (three) times daily.   Yes Historical Provider, MD  diltiazem (CARDIZEM CD) 180 MG 24 hr capsule Take 180 mg by mouth daily.   Yes Historical Provider, MD  furosemide (LASIX) 20 MG tablet  Take 20 mg by mouth daily.   Yes Historical Provider, MD  nebivolol (BYSTOLIC) 10 MG tablet Take 10 mg by mouth daily.   Yes Historical Provider, MD  omeprazole (PRILOSEC) 20 MG capsule Take 20 mg by mouth daily.   Yes Historical Provider, MD  pravastatin (PRAVACHOL) 40 MG tablet Take 40 mg by mouth daily.   Yes Historical Provider, MD  Vitamin D, Ergocalciferol,  (DRISDOL) 50000 UNITS CAPS capsule Take 50,000 Units by mouth every 7 (seven) days.   Yes Historical Provider, MD      VITAL SIGNS:  Blood pressure 92/46, pulse 61, temperature 97.6 F (36.4 C), resp. rate 10, weight 71 kg (156 lb 8.4 oz), SpO2 100 %.  PHYSICAL EXAMINATION:  GENERAL:  81 y.o.-year-old patient lying in the bed with no acute distress.  EYES: Pupils equal, round, reactive to light and accommodation. No scleral icterus. Extraocular muscles intact.  HEENT: Head atraumatic, normocephalic. Oropharynx and nasopharynx clear.  NECK:  Supple, no jugular venous distention. No thyroid enlargement, no tenderness.  LUNGS: Normal breath sounds bilaterally, no wheezing, rales,rhonchi or crepitation. No use of accessory muscles of respiration.  CARDIOVASCULAR: S1, S2 normal. No murmurs, rubs, or gallops.  ABDOMEN: Soft, nontender, nondistended. Bowel sounds present. No organomegaly or mass.  EXTREMITIES: No pedal edema, cyanosis, or clubbing.  NEUROLOGIC: Cranial nerves II through XII are intact. Muscle strength 5/5 in all extremities. Sensation intact. Gait not checked.  PSYCHIATRIC: The patient is alert and oriented x 3.  SKIN: No obvious rash, lesion, or ulcer.   LABORATORY PANEL:   CBC  Recent Labs Lab 02-27-2015 1157  WBC 6.7  HGB 11.2*  HCT 35.3  PLT 251   ------------------------------------------------------------------------------------------------------------------  Chemistries   Recent Labs Lab 2015/02/27 1157  NA 137  K 3.8  CL 102  CO2 19*  GLUCOSE 174*  BUN 22*  CREATININE 1.46*  CALCIUM 8.9  AST 148*  ALT 84*  ALKPHOS 118  BILITOT 0.6   ------------------------------------------------------------------------------------------------------------------  Cardiac Enzymes  Recent Labs Lab 02/27/2015 1157  TROPONINI 0.03   ------------------------------------------------------------------------------------------------------------------  RADIOLOGY:  Dg  Chest 1 View  02/27/15   CLINICAL DATA:  Shortness of breath, near syncope  EXAM: CHEST  1 VIEW  COMPARISON:  Portable exam 1232 hours compared to 10/10/2012  FINDINGS: LEFT subclavian transvenous pacemaker with leads projecting over RIGHT atrium and RIGHT ventricle, unchanged.  Borderline enlargement of cardiac silhouette.  Atherosclerotic calcification aorta.  Mediastinal contours and pulmonary vascularity normal.  Lungs clear.  No pleural effusion or pneumothorax.  Bones demineralized with probable BILATERAL chronic rotator cuff tears.  Surgical clips in the RIGHT cervical region.  IMPRESSION: No acute abnormalities.   Electronically Signed   By: Ulyses Southward M.D.   On: 02-27-15 12:43    EKG:   Orders placed or performed in visit on 09/12/14  . EKG 12-Lead   EKG shows ST elevation in now inferior leads and also V1 to V6. Patient has underlying gout ventricular pacemaker rhythm.  IMPRESSION AND PLAN:   #1 chest pain secondary to ST elevation MI. Symptoms of shortness of breath persistent nausea hypertension CONCERNING. Patient's second troponin is elevated at 0.18 and CK-MB is 5.8. She will be admitted to intensive care unit, start her on aspirin .nitrates and full dose anticoagulation is deferred because she is going to cardiac catheter.   2 .elevated lactic acid with a normal white count unknown reason for the lactic acid elevation evaluated for sepsis with blood cultures urine cultures and empirically Vanco  and Zosyn are given #3. ARF secondary to ATN ATN likely secondary to hypotension continue fluids and use a Levophed if needed the further pressors to keep map above 60. #4 peripheral vascular disease with history of carotid endarterectomy, bilateral leg stents continue statins.  CODE STATUS full code All the records are reviewed and case discussed with ED provider. Management plans discussed with the patient, family and they are in agreement.  CODE STATUS: full  TOTAL TIME TAKING  CARE OF THIS PATIENT: CCT; 55 min Terika Pillard M.D on 02/02/2015 at 2:41 PM  Between 7am to 6pm - Pager - [phone]  After 6pm go to [email or web address] - password [email or web address] [facility]  [place]  [facility]ospitalists  Office  [phone]  CC: Primary care physician; Jerl Mina, MD

## 2015-02-03 NOTE — Progress Notes (Signed)
ANTIBIOTIC CONSULT NOTE - INITIAL  Pharmacy Consult for Vancomycin/Zosyn Indication: rule out sepsis  Allergies  Allergen Reactions  . Norvasc [Amlodipine Besylate] Swelling  . Vesicare [Solifenacin] Swelling  . Advair Diskus [Fluticasone-Salmeterol] Rash  . Sulfa Antibiotics Rash    Patient Measurements: Height: 5\' 2"  (157.5 cm) Weight: 163 lb 12.8 oz (74.3 kg) IBW/kg (Calculated) : 50.1 Adjusted Body Weight: 59.8 kg  Vital Signs: Temp: 97.6 F (36.4 C) (07/19 1236) BP: 134/58 mmHg (07/19 1927) Pulse Rate: 87 (07/19 1927) Intake/Output from previous day:   Intake/Output from this shift:    Labs:  Recent Labs  01/27/2015 1157  WBC 6.7  HGB 11.2*  PLT 251  CREATININE 1.46*   Estimated Creatinine Clearance: 25.6 mL/min (by C-G formula based on Cr of 1.46). No results for input(s): VANCOTROUGH, VANCOPEAK, VANCORANDOM, GENTTROUGH, GENTPEAK, GENTRANDOM, TOBRATROUGH, TOBRAPEAK, TOBRARND, AMIKACINPEAK, AMIKACINTROU, AMIKACIN in the last 72 hours.   Microbiology: No results found for this or any previous visit (from the past 720 hour(s)).  Medical History: Past Medical History  Diagnosis Date  . Hypertension   . Asthma   . Coronary artery disease     Medications:  Scheduled:  . [START ON 02/04/2015] aspirin EC  81 mg Oral Daily  . piperacillin-tazobactam (ZOSYN)  IV  3.375 g Intravenous 3 times per day  . [START ON 02/04/2015] pravastatin  40 mg Oral Daily  . sodium chloride  3 mL Intravenous Q12H  . ticagrelor  90 mg Oral BID  . vancomycin  750 mg Intravenous Q24H   Infusions:  . sodium chloride    . sodium chloride    . norepinephrine Stopped (02/13/2015 1255)   PRN: sodium chloride, acetaminophen, nitroGLYCERIN, ondansetron (ZOFRAN) IV, sodium chloride  Assessment: 81 y/o F admitted with SOB, STEMI, elevated lactic acid level ordered empiric abx for r/o sepsis.   Goal of Therapy:  Vancomycin trough level 15-20 mcg/ml  Plan:  1. Vancomycin 1000 mg iv  once given in ED. Will order vancomycin 750 mg iv q 24 h with stacked dosing and a trough with the 4th dose.   2. Zosyn 3.375 g EI q 8 h.   Will f/u renal function and culture results.  Luisa Hart D 02/14/2015,7:52 PM

## 2015-02-04 ENCOUNTER — Inpatient Hospital Stay: Payer: Medicare Other

## 2015-02-04 ENCOUNTER — Encounter: Payer: Self-pay | Admitting: Internal Medicine

## 2015-02-04 DIAGNOSIS — I469 Cardiac arrest, cause unspecified: Secondary | ICD-10-CM

## 2015-02-04 DIAGNOSIS — I213 ST elevation (STEMI) myocardial infarction of unspecified site: Secondary | ICD-10-CM

## 2015-02-04 DIAGNOSIS — R092 Respiratory arrest: Secondary | ICD-10-CM

## 2015-02-04 DIAGNOSIS — R404 Transient alteration of awareness: Secondary | ICD-10-CM

## 2015-02-04 LAB — CBC
HCT: 35.6 % (ref 35.0–47.0)
HCT: 32.7 % — ABNORMAL LOW (ref 35.0–47.0)
Hemoglobin: 10.6 g/dL — ABNORMAL LOW (ref 12.0–16.0)
Hemoglobin: 9.4 g/dL — ABNORMAL LOW (ref 12.0–16.0)
MCH: 28.2 pg (ref 26.0–34.0)
MCH: 28.3 pg (ref 26.0–34.0)
MCHC: 29.6 g/dL — AB (ref 32.0–36.0)
MCHC: 28.7 g/dL — ABNORMAL LOW (ref 32.0–36.0)
MCV: 95.1 fL (ref 80.0–100.0)
MCV: 98.5 fL (ref 80.0–100.0)
PLATELETS: 184 10*3/uL (ref 150–440)
Platelets: 216 10*3/uL (ref 150–440)
RBC: 3.75 MIL/uL — ABNORMAL LOW (ref 3.80–5.20)
RBC: 3.32 MIL/uL — ABNORMAL LOW (ref 3.80–5.20)
RDW: 15.6 % — ABNORMAL HIGH (ref 11.5–14.5)
RDW: 16.7 % — ABNORMAL HIGH (ref 11.5–14.5)
WBC: 22.9 10*3/uL — ABNORMAL HIGH (ref 3.6–11.0)
WBC: 37.7 10*3/uL — AB (ref 3.6–11.0)

## 2015-02-04 LAB — BASIC METABOLIC PANEL
Anion gap: 24 — ABNORMAL HIGH (ref 5–15)
Anion gap: 17 — ABNORMAL HIGH (ref 5–15)
BUN: 25 mg/dL — ABNORMAL HIGH (ref 6–20)
BUN: 32 mg/dL — ABNORMAL HIGH (ref 6–20)
CALCIUM: 7.7 mg/dL — AB (ref 8.9–10.3)
CO2: 9 mmol/L — AB (ref 22–32)
CO2: 16 mmol/L — ABNORMAL LOW (ref 22–32)
CREATININE: 1.93 mg/dL — AB (ref 0.44–1.00)
Calcium: 6.7 mg/dL — ABNORMAL LOW (ref 8.9–10.3)
Chloride: 105 mmol/L (ref 101–111)
Chloride: 103 mmol/L (ref 101–111)
Creatinine, Ser: 2.65 mg/dL — ABNORMAL HIGH (ref 0.44–1.00)
GFR calc Af Amer: 26 mL/min — ABNORMAL LOW (ref >60)
GFR calc Af Amer: 18 mL/min — ABNORMAL LOW (ref >60)
GFR calc non Af Amer: 22 mL/min — ABNORMAL LOW (ref >60)
GFR calc non Af Amer: 15 mL/min — ABNORMAL LOW (ref >60)
GLUCOSE: 60 mg/dL — AB (ref 65–99)
Glucose, Bld: 250 mg/dL — ABNORMAL HIGH (ref 65–99)
Potassium: 5 mmol/L (ref 3.5–5.1)
Potassium: 4 mmol/L (ref 3.5–5.1)
Sodium: 138 mmol/L (ref 135–145)
Sodium: 136 mmol/L (ref 135–145)

## 2015-02-04 LAB — TROPONIN I: Troponin I: >65 ng/mL — ABNORMAL HIGH (ref <0.031)

## 2015-02-04 LAB — COMPREHENSIVE METABOLIC PANEL
ALT: 1343 U/L — ABNORMAL HIGH (ref 14–54)
AST: >2275 U/L — ABNORMAL HIGH (ref 15–41)
Albumin: 2.4 g/dL — ABNORMAL LOW (ref 3.5–5.0)
Alkaline Phosphatase: 112 U/L (ref 38–126)
Anion gap: 21 — ABNORMAL HIGH (ref 5–15)
BUN: 29 mg/dL — ABNORMAL HIGH (ref 6–20)
CO2: 10 mmol/L — AB (ref 22–32)
CREATININE: 2.49 mg/dL — AB (ref 0.44–1.00)
Calcium: 7 mg/dL — ABNORMAL LOW (ref 8.9–10.3)
Chloride: 106 mmol/L (ref 101–111)
GFR calc non Af Amer: 16 mL/min — ABNORMAL LOW (ref >60)
GFR, EST AFRICAN AMERICAN: 19 mL/min — AB (ref >60)
Glucose, Bld: 151 mg/dL — ABNORMAL HIGH (ref 65–99)
POTASSIUM: 6 mmol/L — AB (ref 3.5–5.1)
SODIUM: 137 mmol/L (ref 135–145)
TOTAL PROTEIN: 5 g/dL — AB (ref 6.5–8.1)
Total Bilirubin: 1 mg/dL (ref 0.3–1.2)

## 2015-02-04 LAB — MAGNESIUM: MAGNESIUM: 1.7 mg/dL (ref 1.7–2.4)

## 2015-02-04 LAB — GLUCOSE, CAPILLARY
Glucose-Capillary: 163 mg/dL — ABNORMAL HIGH (ref 65–99)
Glucose-Capillary: 217 mg/dL — ABNORMAL HIGH (ref 65–99)
Glucose-Capillary: 42 mg/dL — CL (ref 65–99)

## 2015-02-04 LAB — LACTIC ACID, PLASMA: Lactic Acid, Venous: 8 mmol/L (ref 0.5–2.0)

## 2015-02-04 LAB — VANCOMYCIN, RANDOM: Vancomycin Rm: 15 ug/mL

## 2015-02-04 MED ORDER — PIPERACILLIN-TAZOBACTAM 3.375 G IVPB
3.3750 g | Freq: Two times a day (BID) | INTRAVENOUS | Status: DC
Start: 2015-02-04 — End: 2015-02-07
  Administered 2015-02-04 – 2015-02-07 (×6): 3.375 g via INTRAVENOUS
  Filled 2015-02-04 (×8): qty 50

## 2015-02-04 MED ORDER — SODIUM BICARBONATE 8.4 % IV SOLN
100.0000 meq | Freq: Once | INTRAVENOUS | Status: AC
  Administered 2015-02-04: 100 meq via INTRAVENOUS
  Filled 2015-02-04: qty 100

## 2015-02-04 MED ORDER — SODIUM CHLORIDE 0.9 % IV SOLN: 250.0000 mL | INTRAVENOUS | Status: DC | PRN

## 2015-02-04 MED ORDER — HEPARIN SODIUM (PORCINE) 5000 UNIT/ML IJ SOLN
5000.0000 [IU] | Freq: Three times a day (TID) | INTRAMUSCULAR | Status: DC
  Administered 2015-02-04 – 2015-02-07 (×8): 5000 [IU] via SUBCUTANEOUS
  Filled 2015-02-04 (×8): qty 1

## 2015-02-04 MED ORDER — SODIUM CHLORIDE 0.9 % WEIGHT BASED INFUSION: 3.0000 mL/kg/h | INTRAVENOUS | Status: DC

## 2015-02-04 MED ORDER — SODIUM CHLORIDE 0.9 % IJ SOLN: 3.0000 mL | INTRAMUSCULAR | Status: DC | PRN

## 2015-02-04 MED ORDER — SODIUM CHLORIDE 0.9 % IV BOLUS (SEPSIS)
250.0000 mL | Freq: Once | INTRAVENOUS | Status: AC
  Administered 2015-02-04: 250 mL via INTRAVENOUS

## 2015-02-04 MED ORDER — SODIUM CHLORIDE 0.9 % WEIGHT BASED INFUSION: 1.0000 mL/kg/h | INTRAVENOUS | Status: DC

## 2015-02-04 MED ORDER — PANTOPRAZOLE SODIUM 40 MG IV SOLR
40.0000 mg | INTRAVENOUS | Status: DC
  Administered 2015-02-04 – 2015-02-06 (×3): 40 mg via INTRAVENOUS
  Filled 2015-02-04 (×3): qty 40

## 2015-02-04 MED ORDER — SODIUM CHLORIDE 0.9 % IJ SOLN: 3.0000 mL | Freq: Two times a day (BID) | INTRAMUSCULAR | Status: DC

## 2015-02-04 MED ORDER — CHLORHEXIDINE GLUCONATE 0.12 % MT SOLN
15.0000 mL | Freq: Two times a day (BID) | OROMUCOSAL | Status: DC
  Administered 2015-02-04 – 2015-02-07 (×6): 15 mL via OROMUCOSAL

## 2015-02-04 MED ORDER — NOREPINEPHRINE BITARTRATE 1 MG/ML IV SOLN
25.0000 ug/min | INTRAVENOUS | Status: DC
  Administered 2015-02-04: 35 ug/min via INTRAVENOUS
  Administered 2015-02-05: 25 ug/min via INTRAVENOUS
  Administered 2015-02-05: 40 ug/min via INTRAVENOUS
  Administered 2015-02-06 (×2): 25 ug/min via INTRAVENOUS
  Administered 2015-02-07: 20 ug/min via INTRAVENOUS
  Filled 2015-02-04 (×7): qty 16

## 2015-02-04 MED ORDER — VANCOMYCIN HCL IN DEXTROSE 750-5 MG/150ML-% IV SOLN
750.0000 mg | INTRAVENOUS | Status: DC
  Administered 2015-02-04: 750 mg via INTRAVENOUS
  Filled 2015-02-04: qty 150

## 2015-02-04 MED ORDER — EPINEPHRINE HCL 1 MG/ML IJ SOLN
0.5000 ug/min | INTRAVENOUS | Status: DC
  Administered 2015-02-04: 5 ug/min via INTRAVENOUS
  Administered 2015-02-05: 9 ug/min via INTRAVENOUS
  Administered 2015-02-05: 4 ug/min via INTRAVENOUS
  Filled 2015-02-04 (×3): qty 4

## 2015-02-04 MED ORDER — PIPERACILLIN-TAZOBACTAM 3.375 G IVPB
3.3750 g | Freq: Two times a day (BID) | INTRAVENOUS | Status: DC
  Filled 2015-02-04 (×2): qty 50

## 2015-02-04 MED ORDER — ASPIRIN 81 MG PO CHEW: 81.0000 mg | CHEWABLE_TABLET | ORAL | Status: DC

## 2015-02-04 MED ORDER — SODIUM BICARBONATE 8.4 % IV SOLN
INTRAVENOUS | Status: DC
  Administered 2015-02-04 – 2015-02-05 (×2): via INTRAVENOUS
  Filled 2015-02-04 (×7): qty 150

## 2015-02-04 MED ORDER — CETYLPYRIDINIUM CHLORIDE 0.05 % MT LIQD
7.0000 mL | Freq: Four times a day (QID) | OROMUCOSAL | Status: DC
  Administered 2015-02-04 – 2015-02-07 (×10): 7 mL via OROMUCOSAL

## 2015-02-04 MED ORDER — IOHEXOL 300 MG/ML  SOLN
INTRAMUSCULAR | Status: DC | PRN
  Administered 2015-02-03: 250 mL
  Administered 2015-02-03: 160 mL via INTRAVENOUS

## 2015-02-04 NOTE — ED Provider Notes (Signed)
-----------------------------------------   12:07 PM on 02/04/2015 -----------------------------------------  Responded to hospital CODE BLUE that was called at 11:42 AM. Arrived in the room at 11:45 AM with CPR in progress. As the first arriving attending physician I assumed command of the resuscitation effort. Please see the CODE BLUE flowsheet for further details.  Patient was given 1 mg of epinephrine and 1 amp of bicarbonate while CPR continued and the patient was being placed on the defibrillator monitor. There were good femoral pulses with compressions. The patient's cardiac catheterization site in the right groin was hemostatic and without hematoma or bruising.  While continuing the CPR, the intensivist Dr. Linward Natal and arrived to the room.  I turned over control of ACLS resuscitation to the intensivist while I proceeded with airway management.  At about 11:52 AM, pulse check revealed a regular palpable pulse at the carotids. Lungs pressure check was about 110/50. The patient remained unresponsive with agonal breathing. Was unable to assess pupils due to bilateral irregular pupils from prior cataract surgery.  At 1155 RSI was given using etomidate and rocuronium. I intubated the patient with the glidescope using a 7.5 ETT through the oral route. First pass excess. Some secretions were suctioned from the airway. There were good bilateral symmetric breath sounds with no breath sounds over the stomach. Good color change on colorimetric capnometer. The tube was secured by the respiratory therapist. Blood pressure remained adequate, and the patient maintained a spontaneous pulse. Oxygen saturation was 100% after intubation.  At 12:00 I signed out to the intensivist who was planning an arterial line and central venous catheter. We'll hold off on portable chest x-ray until the central line is placed.   Procedures performed: Cardiopulmonary Resuscitation (CPR) Procedure Note Directed/Performed  by: Scotty Court, Raizy Auzenne I personally directed ancillary staff and/or performed CPR in an effort to regain return of spontaneous circulation and to maintain cardiac, neuro and systemic perfusion.   INTUBATION Performed by: Scotty Court, Tobin Cadiente  Required items: required blood products, implants, devices, and special equipment available Patient identity confirmed: provided demographic data and hospital-assigned identification number Time out: Immediately prior to procedure a "time out" was called to verify the correct patient, procedure, equipment, support staff and site/side marked as required.  Indications: Respiratory failure   Intubation method: Glidescope Laryngoscopy   Preoxygenation: 100% FiO2 BVM  Sedatives: 20mg  Etomidate Paralytic: 70mg  rocuronium   Tube Size: 7.5 cuffed  Post-procedure assessment: chest rise and ETCO2 monitor Breath sounds: equal and absent over the epigastrium Tube secured with: ETT holder  Patient tolerated the procedure well with no immediate complications.   Clinical impression: Cardiopulmonary arrest with postresuscitation coma      Sharman Cheek, MD 02/04/15 1215

## 2015-02-04 NOTE — Progress Notes (Signed)
   02/04/15 1230  Clinical Encounter Type  Visited With Family;Health care provider  Visit Type Follow-up  Referral From Chaplain  Consult/Referral To Chaplain  Spiritual Encounters  Spiritual Needs Emotional  Stress Factors  Family Stress Factors Health changes  Chaplain followed up with patient's family to offer emotional and spiritual support as applicable. Chaplain Lashunda Greis A. Christyanna Mckeon Ext. [phone]

## 2015-02-04 NOTE — Progress Notes (Signed)
ANTIBIOTIC CONSULT NOTE - INITIAL  Pharmacy Consult for Vancomycin Indication: rule out sepsis  Allergies  Allergen Reactions  . Norvasc [Amlodipine Besylate] Swelling  . Vesicare [Solifenacin] Swelling  . Advair Diskus [Fluticasone-Salmeterol] Rash  . Sulfa Antibiotics Rash    Patient Measurements: Height: 5\' 2"  (157.5 cm) Weight: 175 lb 4.3 oz (79.5 kg) IBW/kg (Calculated) : 50.1   Vital Signs: Temp: 93.7 F (34.3 C) (07/20 1936) Temp Source: Axillary (07/20 1218) BP: 120/51 mmHg (07/20 1936) Pulse Rate: 80 (07/20 1936) Intake/Output from previous day: 07/19 0701 - 07/20 0700 In: 1171.8 [I.V.:921.8; IV Piggyback:250] Out: -  Intake/Output from this shift:    Labs:  Recent Labs  02-10-2015 1157 02/04/15 0400 02/04/15 1345 02/04/15 2028  WBC 6.7 22.9* 37.7*  --   HGB 11.2* 10.6* 9.4*  --   PLT 251 216 184  --   CREATININE 1.46* 1.93* 2.49* 2.65*   Estimated Creatinine Clearance: 14.6 mL/min (by C-G formula based on Cr of 2.65).  Recent Labs  02/04/15 2028  [facility] 15     Microbiology: Recent Results (from the past 720 hour(s))  MRSA PCR Screening     Status: None   Collection Time: 02/10/15 11:43 AM  Result Value Ref Range Status   MRSA by PCR NEGATIVE NEGATIVE Final    Comment:        The GeneXpert MRSA Assay (FDA approved for NASAL specimens only), is one component of a comprehensive MRSA colonization surveillance program. It is not intended to diagnose MRSA infection nor to guide or monitor treatment for MRSA infections.     Medical History: Past Medical History  Diagnosis Date  . Hypertension   . Asthma   . Coronary artery disease     Medications:  Scheduled:  . [START ON 02/05/2015] antiseptic oral rinse  7 mL Mouth Rinse QID  . aspirin EC  81 mg Oral Daily  . chlorhexidine  15 mL Mouth Rinse BID  . heparin subcutaneous  5,000 Units Subcutaneous 3 times per day  . pantoprazole (PROTONIX) IV  40 mg Intravenous Q24H  .  piperacillin-tazobactam (ZOSYN)  IV  3.375 g Intravenous Q12H  . pravastatin  40 mg Oral Daily  . ticagrelor  90 mg Oral BID  . vancomycin  750 mg Intravenous Q48H   Infusions:  . epinephrine 5 mcg/min (02/04/15 1435)  . norepinephrine (LEVOPHED) Adult infusion 35 mcg/min (02/04/15 1655)  .  sodium bicarbonate  infusion 1000 mL 125 mL/hr at 02/04/15 1724   PRN: acetaminophen, nitroGLYCERIN, ondansetron (ZOFRAN) IV  Assessment: 81 y/o F admitted with SOB, STEMI, elevated lactic acid level ordered empiric abx for r/o sepsis. Patient with elevated SCR, AKI so level was ordered subsequent to the first dose. The level is virtually meaningless except to r/o toxicity as this would be nowhere near steady state.    Goal of Therapy:  Vancomycin trough level 15-20 mcg/ml  Plan:  Will order vancomycin 750 mg iv q 48 h and f/u renal function and likely dose by levels.   Luisa Hart D 02/04/2015,9:06 PM

## 2015-02-04 NOTE — Progress Notes (Addendum)
[facility] - [place] at [facility]   PATIENT NAME: Sophia Bradley    MR#:  [id number]  DATE OF BIRTH:  05-30-1926  SUBJECTIVE:  CHIEF COMPLAINT:   Chief Complaint  Patient presents with  . Shortness of Breath   The patient had nausea and vomiting, possibly aspirated. Blood pressure decreased to 62/40. She was found unresponsive and pulseless. CODE BLUE was started with CPR. Patient was intubated. Her blood sugar was 42. Her pulses resumed.  REVIEW OF SYSTEMS:  unable to obtain.  DRUG ALLERGIES:   Allergies  Allergen Reactions  . Norvasc [Amlodipine Besylate] Swelling  . Vesicare [Solifenacin] Swelling  . Advair Diskus [Fluticasone-Salmeterol] Rash  . Sulfa Antibiotics Rash    VITALS:  Blood pressure 89/58, pulse 65, temperature 96.6 F (35.9 C), temperature source Rectal, resp. rate 19, height  (1.575 m), weight 74.3 kg (163 lb 12.8 oz), SpO2 99 %.  PHYSICAL EXAMINATION:  GENERAL:  81 y.o.-year-old patient lying in the bed, intubated and on Ventilation. EYES: Pupils equal, round, reactive to light and accommodation. No scleral icterus. Extraocular muscles intact.  HEENT: Head atraumatic, normocephalic. On intubation and ventilation.  NECK:  Supple, no jugular venous distention. No thyroid enlargement, no tenderness.  LUNGS: Normal breath sounds bilaterally, no wheezing, or lateral rales.  CARDIOVASCULAR: S1, S2 normal. No murmurs, rubs, or gallops.  ABDOMEN: Soft, nontender, nondistended. Bowel sounds present. No organomegaly or mass.  EXTREMITIES: No pedal edema, cyanosis, or clubbing.  NEUROLOGIC: Unresponsive on ventilation. PSYCHIATRIC: Unresponsive on ventilation. SKIN: No obvious rash, lesion, or ulcer.    LABORATORY PANEL:   CBC  Recent Labs Lab 02/04/15 0400  WBC 22.9*  HGB 10.6*  HCT 35.6  PLT 216    ------------------------------------------------------------------------------------------------------------------  Chemistries   Recent Labs Lab 03-02-15 1157 02/04/15 0400  NA 137 138  K 3.8 5.0  CL 102 105  CO2 19* 9*  GLUCOSE 174* 60*  BUN 22* 25*  CREATININE 1.46* 1.93*  CALCIUM 8.9 7.7*  MG  --  1.7  AST 148*  --   ALT 84*  --   ALKPHOS 118  --   BILITOT 0.6  --    ------------------------------------------------------------------------------------------------------------------  Cardiac Enzymes  Recent Labs Lab 02/04/15 0359  TROPONINI >65.00*   ------------------------------------------------------------------------------------------------------------------  RADIOLOGY:  Dg Chest 1 View  02-Mar-2015   CLINICAL DATA:  Shortness of breath, near syncope  EXAM: CHEST  1 VIEW  COMPARISON:  Portable exam 1232 hours compared to 10/10/2012  FINDINGS: LEFT subclavian transvenous pacemaker with leads projecting over RIGHT atrium and RIGHT ventricle, unchanged.  Borderline enlargement of cardiac silhouette.  Atherosclerotic calcification aorta.  Mediastinal contours and pulmonary vascularity normal.  Lungs clear.  No pleural effusion or pneumothorax.  Bones demineralized with probable BILATERAL chronic rotator cuff tears.  Surgical clips in the RIGHT cervical region.  IMPRESSION: No acute abnormalities.   Electronically Signed   By: Ulyses Southward M.D.   On: 03/02/2015 12:43    EKG:   Orders placed or performed during the hospital encounter of 03/02/15  . EKG 12-Lead immediately post procedure  . EKG 12-Lead  . EKG 12-Lead immediately post procedure  . EKG 12-Lead  . EKG 12-Lead  . EKG 12-Lead  . EKG 12-Lead  . EKG 12-Lead  . EKG 12-Lead  . EKG 12-Lead  . EKG 12-Lead  . EKG 12-Lead  . EKG 12-Lead  . EKG 12-Lead    ASSESSMENT AND PLAN:   * Acute respiratory failure with hypoxia. Status post  intubation. Continue ventilation and nebulizer treatment,  follow-up  pulmonary physician.  *Hypotension. Possible cardiac etiology due to MI. Discontinue normal saline and start Levophed drip.   * Possible aspiration pneumonia. Follow-up chest x-ray and start Zosyn.  * Hypoglycemia. The patient was treated with the D50 one dose.  #1 Non- ST elevation MI. Troponin>65. S/p PCI, continue aspirin, ticagrelor, pravachol.  2 .Lactic acidosis. Due to #1. F/u level.  * Leukocytosis, possible due to MI and PCI, No source of infection. f/u blood cultures urine cultures, hold Vanco and Zosyn.  #3. ARF secondary to ATN ATN likely secondary to hypotension. Worsening. discontinue NS due to respiratory failure.. F/u BMP.  I discussed with on-call pulmonary physician. I discussed with the patient daughter, who is her POA and other family members about the patient's critical condition and very poor prognosis.  She wants the patient on full code for now but she doesn't want patient on long-term life support.  All the records are reviewed and case discussed with Care Management/Social Workerr. Management plans discussed with the patient, family and they are in agreement.  CODE STATUS: Full code at this time.  Additional CRITICAL TIME TAKING CARE OF THIS PATIENT: 79 minutes.   POSSIBLE D/C IN unknown DAYS, DEPENDING ON CLINICAL CONDITION.   Shaune Pollack M.D on 02/04/2015 at 12:11 PM  Between 7am to 6pm - Pager - [phone]  After 6pm go to [email or web address] - password [email or web address] [facility]  [place] [facility]  Office  [phone]  CC: Primary care physician; Jerl Mina, MD

## 2015-02-04 NOTE — Progress Notes (Signed)
[facility] Encounter Note  Patient: Sophia Bradley / Admit Date: 02/27/2015 / Date of Encounter: 02/04/2015, 8:22 AM   Subjective: Nausea and weakness this morning but hemodynamically stable  Review of Systems: Positive for: Nausea weakness Negative for: Vision change, hearing change, syncope, dizziness,  vomiting,diarrhea, bloody stool, stomach pain, cough, congestion, diaphoresis, urinary frequency, urinary pain,skin lesions, skin rashes Others previously listed  Objective: Telemetry: Sinus tachycardia Physical Exam: Blood pressure 79/63, pulse 65, temperature 96.8 F (36 C), temperature source Rectal, resp. rate 19, height 5\' 2"  (1.575 m), weight 163 lb 12.8 oz (74.3 kg), SpO2 100 %. Body mass index is 29.95 kg/(m^2). General: Well developed, well nourished, in no acute distress. Head: Normocephalic, atraumatic, sclera non-icteric, no xanthomas, nares are without discharge. Neck: No apparent masses Lungs: Normal respirations with no wheezes, no rhonchi, no rales , no crackles   Heart: Regular rate and rhythm, normal S1 S2, no murmur, no rub, no gallop, PMI is normal size and placement, carotid upstroke normal without bruit, jugular venous pressure normal Abdomen: Soft, non-tender, non-distended with normoactive bowel sounds. No hepatosplenomegaly. Abdominal aorta is normal size without bruit Extremities: No edema, no clubbing, no cyanosis, no ulcers,  Peripheral: 2+ radial, 2+ femoral, 2+ dorsal pedal pulses Neuro: Alert and oriented. Moves all extremities spontaneously. Psych:  Responds to questions appropriately with a normal affect.   Intake/Output Summary (Last 24 hours) at 02/04/15 7564 Last data filed at 02/04/15 0500  Gross per 24 hour  Intake 1171.82 ml  Output      0 ml  Net 1171.82 ml    Inpatient Medications:  . aspirin EC  81 mg Oral Daily  . piperacillin-tazobactam (ZOSYN)  IV  3.375 g Intravenous Q12H  . pravastatin  40 mg Oral Daily   . sodium chloride  250 mL Intravenous Once  . sodium chloride  3 mL Intravenous Q12H  . ticagrelor  90 mg Oral BID   Infusions:  . sodium chloride 100 mL/hr at 02/04/15 0500  . norepinephrine Stopped (27-Feb-2015 1255)    Labs:  Recent Labs  27-Feb-2015 1157 02/04/15 0400  NA 137 138  K 3.8 5.0  CL 102 105  CO2 19* 9*  GLUCOSE 174* 60*  BUN 22* 25*  CREATININE 1.46* 1.93*  CALCIUM 8.9 7.7*    Recent Labs  Feb 27, 2015 1157  AST 148*  ALT 84*  ALKPHOS 118  BILITOT 0.6  PROT 6.5  ALBUMIN 3.2*    Recent Labs  February 27, 2015 1157 02/04/15 0400  WBC 6.7 22.9*  NEUTROABS 5.0  --   HGB 11.2* 10.6*  HCT 35.3 35.6  MCV 89.9 95.1  PLT 251 216    Recent Labs  2015-02-27 1157 02-27-15 1331 February 27, 2015 2029 02/04/15 0359  CKTOTAL 80  --   --   --   CKMB  --  5.8*  --   --   TROPONINI 0.03 0.18* >65.00* >65.00*   Invalid input(s): POCBNP No results for input(s): HGBA1C in the last 72 hours.   Weights: Filed Weights   Feb 27, 2015 1236 02/27/15 1927  Weight: 156 lb 8.4 oz (71 kg) 163 lb 12.8 oz (74.3 kg)     Radiology/Studies:  Dg Chest 1 View  Feb 27, 2015   CLINICAL DATA:  Shortness of breath, near syncope  EXAM: CHEST  1 VIEW  COMPARISON:  Portable exam 1232 hours compared to 10/10/2012  FINDINGS: LEFT subclavian transvenous pacemaker with leads projecting over RIGHT atrium and RIGHT ventricle, unchanged.  Borderline enlargement of cardiac silhouette.  Atherosclerotic calcification aorta.  Mediastinal contours and pulmonary vascularity normal.  Lungs clear.  No pleural effusion or pneumothorax.  Bones demineralized with probable BILATERAL chronic rotator cuff tears.  Surgical clips in the RIGHT cervical region.  IMPRESSION: No acute abnormalities.   Electronically Signed   By: Ulyses Southward M.D.   On: 02/14/2015 12:43     Assessment and Recommendation  81 y.o. female with the paroxysmal nonvalvular atrial fibrillation and coronary artery disease with acute inferior non-ST elevation  myocardial infarction Catheterization shows mild inferior hypokinesis with ejection fraction of 50% and ostial stenosis of right coronary artery status post successful PCI and stent placement of right coronary artery 1. Continue Plavix and aspirin for further risk reduction of in-stent thrombosis of PCI and stent placement of ostial right coronary artery 2. Hydration for further risk reduction in hypotension and nausea and vomiting 3. Consider Zofran for nausea 4. Begin ambulation and cardiac rehabilitation has been recommended and discussed today 5. Possible transfer to telemetry if able  Signed, Arnoldo Hooker M.D. FACC

## 2015-02-04 NOTE — Progress Notes (Addendum)
[facility] - [place] at [facility]   PATIENT NAME: Sophia Bradley    MR#:  [id number]  DATE OF BIRTH:  23-Aug-1925  SUBJECTIVE:  CHIEF COMPLAINT:   Chief Complaint  Patient presents with  . Shortness of Breath  nausea and weakness  REVIEW OF SYSTEMS:  CONSTITUTIONAL: No fever, has weakness.  EYES: No blurred or double vision.  EARS, NOSE, AND THROAT: No tinnitus or ear pain.  RESPIRATORY: No cough, shortness of breath, wheezing or hemoptysis.  CARDIOVASCULAR: No chest pain, orthopnea, edema.  GASTROINTESTINAL: has nausea, no vomiting, diarrhea or abdominal pain.  GENITOURINARY: No dysuria, hematuria.  ENDOCRINE: No polyuria, nocturia,  HEMATOLOGY: No anemia, easy bruising or bleeding SKIN: No rash or lesion. MUSCULOSKELETAL: No joint pain or arthritis.   NEUROLOGIC: No tingling, numbness, weakness.  PSYCHIATRY: No anxiety or depression.   DRUG ALLERGIES:   Allergies  Allergen Reactions  . Norvasc [Amlodipine Besylate] Swelling  . Vesicare [Solifenacin] Swelling  . Advair Diskus [Fluticasone-Salmeterol] Rash  . Sulfa Antibiotics Rash    VITALS:  Blood pressure 91/76, pulse 65, temperature 96.6 F (35.9 C), temperature source Rectal, resp. rate 35, height 5\' 2"  (1.575 m), weight 74.3 kg (163 lb 12.8 oz), SpO2 100 %.  PHYSICAL EXAMINATION:  GENERAL:  81 y.o.-year-old patient lying in the bed, lethargic. EYES: Pupils equal, round, reactive to light and accommodation. No scleral icterus. Extraocular muscles intact.  HEENT: Head atraumatic, normocephalic. Oropharynx and nasopharynx clear.  NECK:  Supple, no jugular venous distention. No thyroid enlargement, no tenderness.  LUNGS: Normal breath sounds bilaterally, no wheezing, rales,rhonchi or crepitation. No use of accessory muscles of respiration.  CARDIOVASCULAR: S1, S2 normal. No murmurs, rubs, or gallops.  ABDOMEN: Soft, nontender, nondistended. Bowel sounds present. No organomegaly or mass.   EXTREMITIES: Trace leg edema, no cyanosis, or clubbing.  NEUROLOGIC: Cranial nerves II through XII are intact. Muscle strength 4/5 in all extremities. Sensation intact. Gait not checked.  PSYCHIATRIC: The patient is alert and oriented x 2.  SKIN: No obvious rash, lesion, or ulcer.  Arm bruises.   LABORATORY PANEL:   CBC  Recent Labs Lab 02/04/15 0400  WBC 22.9*  HGB 10.6*  HCT 35.6  PLT 216   ------------------------------------------------------------------------------------------------------------------  Chemistries   Recent Labs Lab 2015/02/18 1157 02/04/15 0400  NA 137 138  K 3.8 5.0  CL 102 105  CO2 19* 9*  GLUCOSE 174* 60*  BUN 22* 25*  CREATININE 1.46* 1.93*  CALCIUM 8.9 7.7*  AST 148*  --   ALT 84*  --   ALKPHOS 118  --   BILITOT 0.6  --    ------------------------------------------------------------------------------------------------------------------  Cardiac Enzymes  Recent Labs Lab 02/04/15 0359  TROPONINI >65.00*   ------------------------------------------------------------------------------------------------------------------  RADIOLOGY:  Dg Chest 1 View  02-18-15   CLINICAL DATA:  Shortness of breath, near syncope  EXAM: CHEST  1 VIEW  COMPARISON:  Portable exam 1232 hours compared to 10/10/2012  FINDINGS: LEFT subclavian transvenous pacemaker with leads projecting over RIGHT atrium and RIGHT ventricle, unchanged.  Borderline enlargement of cardiac silhouette.  Atherosclerotic calcification aorta.  Mediastinal contours and pulmonary vascularity normal.  Lungs clear.  No pleural effusion or pneumothorax.  Bones demineralized with probable BILATERAL chronic rotator cuff tears.  Surgical clips in the RIGHT cervical region.  IMPRESSION: No acute abnormalities.   Electronically Signed   By: Ulyses Southward M.D.   On: 02/18/15 12:43    EKG:   Orders placed or performed during the hospital encounter of February 18, 2015  .  EKG 12-Lead immediately post  procedure  . EKG 12-Lead  . EKG 12-Lead immediately post procedure  . EKG 12-Lead  . EKG 12-Lead  . EKG 12-Lead  . EKG 12-Lead  . EKG 12-Lead  . EKG 12-Lead  . EKG 12-Lead  . EKG 12-Lead  . EKG 12-Lead  . EKG 12-Lead  . EKG 12-Lead    ASSESSMENT AND PLAN:   #1 Non- ST elevation MI.  Troponin>65. S/p PCI, continue aspirin, ticagrelor, pravachol, nitrates.  2 .Lactic acidosis. Due to #1. F/u level.   * Leukocytosis, possible due to MI and PCI,  No source of infection. f/u blood cultures urine cultures, hold Vanco and Zosyn.  #3. ARF secondary to ATN ATN likely secondary to hypotension. Worsening.  continue NS. F/u BMP.  #4 peripheral vascular disease with history of carotid endarterectomy, bilateral leg stents.  continue statins.  * Hypotension. NS bolus then iv. Pressor drip prn.  The patient's blood pressure decreased to 44/22 just now. In addition, patient developed respiratory distress, O2 saturation decreased to 80s, she is on NRB. I will discontinue normal saline, start Levophed drip, get a PICC line and chest x-ray.   All the records are reviewed and case discussed with Care Management/Social Workerr. Management plans discussed with the patient, family and they are in agreement. Discussed with patient's daughter and niece her critical condition and High risk for cardiopulmonary arrest. Her daughter is her POA who wants full code at this time.  CODE STATUS: full code.  TOTAL CRITICAL TIME TAKING CARE OF THIS PATIENT: 56 minutes.   POSSIBLE D/C IN 3 DAYS, DEPENDING ON CLINICAL CONDITION.   Shaune Pollack M.D on 02/04/2015 at 8:49 AM  Between 7am to 6pm - Pager - [phone]  After 6pm go to [email or web address] - password [email or web address] [facility]  [facility]  Office  [phone]  CC: Primary care physician; Jerl Mina, MD

## 2015-02-04 NOTE — Progress Notes (Signed)
Pt lying in bed pt completed Aggrastat as ordered cont. On IVF's @100ml . Pt has nausea / small amount of saliva vomitus x 3 with any type of movement while in the bed. Pt has mynx closure to rt groin intact with no hematoma noted.

## 2015-02-04 NOTE — Progress Notes (Signed)
Visit Assessment: Status: nonverbal/code b Chaplain introduced pastoral care and gave hugs and encouraging words to the patient's grieving family (husband, daughter, grandson, son in Social worker). Staff Chaplain will be visiting with family as well.

## 2015-02-04 NOTE — Care Management Note (Signed)
Case Management Note  Patient Details  Name: Sophia Bradley MRN: [id number] Date of Birth: 1926/03/31  Subjective/Objective:   Admitted from home with a NSTEMI, s/p PCI. Transferred to ICU, Coded this am. Now in post resuscitation coma. Patient remains a full code at this time.  Will monitor progress                 Action/Plan:   Expected Discharge Date:                  Expected Discharge Plan:     In-House Referral:     Discharge planning Services     Post Acute Care Choice:    Choice offered to:     DME Arranged:    DME Agency:     HH Arranged:    HH Agency:     Status of Service:     Medicare Important Message Given:    Date Medicare IM Given:    Medicare IM give by:    Date Additional Medicare IM Given:    Additional Medicare Important Message give by:     If discussed at Long Length of Stay Meetings, dates discussed:    Additional Comments:  Marily Memos, RN 02/04/2015, 2:20 PM

## 2015-02-04 NOTE — Therapy (Signed)
Called to patient's room by nurse. Nurse reported she had started the patient on a NRB mask due to dropping SpO2. Patient found with B/P 62/40, HR 68, RR 18 and essentially unresponsive. I was preparing to start patient on BiPap until further evaluation could be made. Patient had a change in respiratory pattern to agonal. Unable to palpate a pulse, Code Blue called, patient manually ventilated with FiO2 1.00 and compressions started. Pulse was regained after ACLS. Patient orally intubated by ED physician, Dr. Scotty Court with #7.5 ETT, secured at 21 cm. Bilat breath sounds appreciated, positive CO2 detector response. CXR ordered. Patient placed on vent at ordered settings. ABG pending. Dr. Ardyth Man inserting an A-line.

## 2015-02-04 NOTE — Procedures (Addendum)
Central Venous Catheter Placement: Indication: Patient receiving vesicant or irritant drug.; Patient receiving intravenous therapy for longer than 5 days.; Patient has limited or no vascular access.    Consent:emergent  Hand washing performed prior to starting the procedure.   Procedure: An active timeout was performed and correct patient, name, & ID confirmed.Patient was prepped using strict sterile technique including chlorohexadine preps, sterile drape, sterile gown and sterile gloves.  The area was prepped, draped and anesthetized in the usual sterile manner. Patient comfort was obtained.  A triple lumen catheter was placed in left Internal Jugular Vein There was good blood return, catheter caps were placed on lumens, catheter flushed easily, the line was secured and a sterile dressing and BIO-PATCH applied.   Ultrasound was used to visualize vasculature and guidance of needle.   Number of Attempts: 1 Complications:none Estimated Blood Loss: none Chest Radiograph indicated and ordered.  Operator: Wells Guiles, M.D.   Attempt made at left radial artery arterial line under ultrasound guidance: There was poor right radial pulse, with right wrist edema. Right femoral area was accessed for heart cath, left femoral US showed that the artery was directly posterior to the vein. Therefore left radial was visualized, albeit poorly. The artery could not be cannulated, after 1 attempt and abandoned as we appeared to have good cuff pressure.    Wells Guiles, M.D.  Pulmonary & Critical Care Medicine [facility] Medical Director Intensive Care Unit

## 2015-02-04 NOTE — Progress Notes (Signed)
At 11:15 pts bp was 76/36 map was 50 in right arm, rechecked BP in left arm and got a map of 61. Crackles in lungs bilaterally, called Dr. Imogene Burn about starting Levophed and getting an order for a PICC line. Pt O2 was 83%, put non-rebreather on pt, sats still didn't come up to >88%, called RT. While RT was setting up bipap pt vomited clear fluid. Set up suction, called charge and notified secretary to call a rapid response. While waiting for the rapid to be called the pt coded and a code blue was initiated.

## 2015-02-04 NOTE — Progress Notes (Addendum)
Spoke with Dr. Sheryle Hail in reference to potassium level and orders of heparin with Brilinta and the bruising noted . Orders received to repeat BMP and administer meds as ordered. Pt family in to visit this shift.very supportive to patient. Will cont to monitor.

## 2015-02-04 NOTE — Progress Notes (Signed)
Response to Code Blue.   I, Dr. Linward Natal responded to overhead code, and arrived to the room. I assumed care from the ER physician, Dr. Scotty Court who had responded to the code, and who subsequently intubated the patient.   At about 11:52 AM, pulse check revealed a regular palpable pulse at the carotids. Lungs pressure check was about 110/50. The patient remained unresponsive with agonal breathing. Was unable to assess pupils due to bilateral irregular pupils from prior cataract surgery.  At 1155 RSI was given using etomidate and rocuronium.  Procedures performed: Cardiopulmonary Resuscitation (CPR) Procedure Note Directed/Performed by: Shane Crutch I personally directed ancillary staff and/or performed CPR in an effort to regain return of spontaneous circulation and to maintain cardiac, neuro and systemic perfusion.    Clinical impression: Cardiopulmonary arrest with postresuscitation coma. Patient discussed with hospitalist physician, RN.     Wells Guiles, M.D.  I have personally obtained a history, examined the patient, evaluated laboratory and imaging results, formulated the assessment and plan and placed orders. The Patient requires high complexity decision making for assessment and support, frequent evaluation and titration of therapies, application of advanced monitoring technologies and extensive interpretation of multiple databases. The patient has critical illness that could lead imminently to failure of 1 or more organ systems and requires the highest level of physician preparedness to intervene.  Critical Care Time devoted to patient care services described in this note is 45 minutes and is exclusive of time spent in procedures.

## 2015-02-04 NOTE — Progress Notes (Signed)
ANTIBIOTIC CONSULT NOTE - FOLLOW UP   Pharmacy Consult for Vancomycin/Zosyn Indication: rule out sepsis  Allergies  Allergen Reactions  . Norvasc [Amlodipine Besylate] Swelling  . Vesicare [Solifenacin] Swelling  . Advair Diskus [Fluticasone-Salmeterol] Rash  . Sulfa Antibiotics Rash    Patient Measurements: Height: 5\' 2"  (157.5 cm) Weight: 163 lb 12.8 oz (74.3 kg) IBW/kg (Calculated) : 50.1 Adjusted Body Weight: 59.8 kg  Vital Signs: Temp: 96.8 F (36 C) (07/20 0730) Temp Source: Rectal (07/20 0000) BP: 79/63 mmHg (07/20 0700) Pulse Rate: 65 (07/20 0730) Intake/Output from previous day: 07/19 0701 - 07/20 0700 In: 1171.8 [I.V.:921.8; IV Piggyback:250] Out: -  Intake/Output from this shift:    Labs:  Recent Labs  02-15-15 1157 02/04/15 0400  WBC 6.7 22.9*  HGB 11.2* 10.6*  PLT 251 216  CREATININE 1.46* 1.93*   Estimated Creatinine Clearance: 19.4 mL/min (by C-G formula based on Cr of 1.93). No results for input(s): VANCOTROUGH, VANCOPEAK, VANCORANDOM, GENTTROUGH, GENTPEAK, GENTRANDOM, TOBRATROUGH, TOBRAPEAK, TOBRARND, AMIKACINPEAK, AMIKACINTROU, AMIKACIN in the last 72 hours.   Microbiology: Recent Results (from the past 720 hour(s))  MRSA PCR Screening     Status: None   Collection Time: 2015-02-15 11:43 AM  Result Value Ref Range Status   MRSA by PCR NEGATIVE NEGATIVE Final    Comment:        The GeneXpert MRSA Assay (FDA approved for NASAL specimens only), is one component of a comprehensive MRSA colonization surveillance program. It is not intended to diagnose MRSA infection nor to guide or monitor treatment for MRSA infections.     Medical History: Past Medical History  Diagnosis Date  . Hypertension   . Asthma   . Coronary artery disease     Medications:  Scheduled:  . aspirin EC  81 mg Oral Daily  . piperacillin-tazobactam (ZOSYN)  IV  3.375 g Intravenous Q12H  . pravastatin  40 mg Oral Daily  . sodium chloride  250 mL Intravenous  Once  . sodium chloride  3 mL Intravenous Q12H  . ticagrelor  90 mg Oral BID  . vancomycin  750 mg Intravenous Q24H   Infusions:  . sodium chloride 100 mL/hr at 02/04/15 0500  . norepinephrine Stopped (02-15-15 1255)   PRN: sodium chloride, acetaminophen, nitroGLYCERIN, ondansetron (ZOFRAN) IV, sodium chloride  Assessment: 81 y/o F admitted with SOB, STEMI, elevated lactic acid level ordered empiric abx for r/o sepsis.   Patient now in AKI. Scr bumped from ~1.4 to 1.93 mg/dl   Goal of Therapy:  Vancomycin trough level 15-20 mcg/ml  Plan:  1. Will order random Vancomycin level to be drawn ~ 24 hours post dose to assess appropriateness of dosing strategy due to patient being in AKI.   2. Zosyn: Will change to Zosyn 3.375 g IV q12 hours from q8 hours due to patient's renal insufficiency.   Will f/u renal function and culture results.  Demetrius Charity, PharmD 02/04/2015,8:01 AM

## 2015-02-04 NOTE — Progress Notes (Signed)
Unable to obtain temp, rectal temp 25F, bair hugger applied.

## 2015-02-04 NOTE — Code Documentation (Signed)
Code Blue called. On arrival CPR in progress, defib pads placed. See code sheet for medications/actions.ROSC @1150 .

## 2015-02-05 ENCOUNTER — Inpatient Hospital Stay
Admit: 2015-02-05 | Discharge: 2015-02-05 | Disposition: A | Discharge status: Home / Self Care | Payer: Medicare Other | Attending: Internal Medicine | Admitting: Internal Medicine

## 2015-02-05 DIAGNOSIS — G9340 Encephalopathy, unspecified: Secondary | ICD-10-CM

## 2015-02-05 DIAGNOSIS — K759 Inflammatory liver disease, unspecified: Secondary | ICD-10-CM

## 2015-02-05 DIAGNOSIS — J9601 Acute respiratory failure with hypoxia: Secondary | ICD-10-CM

## 2015-02-05 DIAGNOSIS — K7200 Acute and subacute hepatic failure without coma: Secondary | ICD-10-CM

## 2015-02-05 DIAGNOSIS — N179 Acute kidney failure, unspecified: Secondary | ICD-10-CM

## 2015-02-05 DIAGNOSIS — J9600 Acute respiratory failure, unspecified whether with hypoxia or hypercapnia: Secondary | ICD-10-CM

## 2015-02-05 DIAGNOSIS — I469 Cardiac arrest, cause unspecified: Secondary | ICD-10-CM | POA: Diagnosis not present

## 2015-02-05 DIAGNOSIS — I959 Hypotension, unspecified: Secondary | ICD-10-CM | POA: Clinically undetermined

## 2015-02-05 DIAGNOSIS — R570 Cardiogenic shock: Secondary | ICD-10-CM

## 2015-02-05 LAB — BLOOD GAS, VENOUS
FIO2: 0.7 %
LHR: 15 {breaths}/min
MECHANICAL RATE: 15
PCO2 VEN: 48 mmHg (ref 44.0–60.0)
PEEP: 5 cmH2O
PH VEN: 6.87 — AB (ref 7.320–7.430)
Patient temperature: 37
VT: 450 mL

## 2015-02-05 LAB — BLOOD GAS, ARTERIAL
Acid-base deficit: 8.2 mmol/L — ABNORMAL HIGH (ref 0.0–2.0)
Allens test (pass/fail): POSITIVE — AB
Bicarbonate: 17.9 mEq/L — ABNORMAL LOW (ref 21.0–28.0)
FIO2: 0.7 %
FIO2: 0.7 %
LHR: 15 {breaths}/min
MECHVT: 450 mL
Mechanical Rate: 15
O2 Saturation: 95.4 %
O2 Saturation: 98.5 %
PCO2 ART: 42 mmHg (ref 32.0–48.0)
PCO2 ART: 38 mmHg (ref 32.0–48.0)
PEEP: 5 cmH2O
PEEP: 5 cmH2O
PH ART: 6.86 — AB (ref 7.350–7.450)
PO2 ART: 127 mmHg — AB (ref 83.0–108.0)
Patient temperature: 37
Patient temperature: 37
RATE: 15 resp/min
VT: 450 mL
pH, Arterial: 7.28 — ABNORMAL LOW (ref 7.350–7.450)
pO2, Arterial: 91 mmHg (ref 83.0–108.0)

## 2015-02-05 LAB — COMPREHENSIVE METABOLIC PANEL
ALT: 2210 U/L — ABNORMAL HIGH (ref 14–54)
ANION GAP: 15 (ref 5–15)
AST: >2275 U/L — ABNORMAL HIGH (ref 15–41)
Albumin: 2.3 g/dL — ABNORMAL LOW (ref 3.5–5.0)
Alkaline Phosphatase: 87 U/L (ref 38–126)
BUN: 38 mg/dL — AB (ref 6–20)
CO2: 20 mmol/L — ABNORMAL LOW (ref 22–32)
Calcium: 6.6 mg/dL — ABNORMAL LOW (ref 8.9–10.3)
Chloride: 100 mmol/L — ABNORMAL LOW (ref 101–111)
Creatinine, Ser: 3.08 mg/dL — ABNORMAL HIGH (ref 0.44–1.00)
GFR calc Af Amer: 15 mL/min — ABNORMAL LOW (ref >60)
GFR, EST NON AFRICAN AMERICAN: 13 mL/min — AB (ref >60)
Glucose, Bld: 233 mg/dL — ABNORMAL HIGH (ref 65–99)
POTASSIUM: 3.8 mmol/L (ref 3.5–5.1)
SODIUM: 135 mmol/L (ref 135–145)
Total Bilirubin: 1 mg/dL (ref 0.3–1.2)
Total Protein: 4.7 g/dL — ABNORMAL LOW (ref 6.5–8.1)

## 2015-02-05 LAB — GLUCOSE, CAPILLARY
Glucose-Capillary: 100 mg/dL — ABNORMAL HIGH (ref 65–99)
Glucose-Capillary: 105 mg/dL — ABNORMAL HIGH (ref 65–99)
Glucose-Capillary: 112 mg/dL — ABNORMAL HIGH (ref 65–99)
Glucose-Capillary: 157 mg/dL — ABNORMAL HIGH (ref 65–99)
Glucose-Capillary: 176 mg/dL — ABNORMAL HIGH (ref 65–99)
Glucose-Capillary: 202 mg/dL — ABNORMAL HIGH (ref 65–99)

## 2015-02-05 LAB — CBC WITH DIFFERENTIAL/PLATELET
Basophils Absolute: 0 10*3/uL (ref 0–0.1)
Basophils Relative: 0 %
Eosinophils Absolute: 0.2 10*3/uL (ref 0–0.7)
Eosinophils Relative: 1 %
HCT: 31.6 % — ABNORMAL LOW (ref 35.0–47.0)
Hemoglobin: 9.8 g/dL — ABNORMAL LOW (ref 12.0–16.0)
Lymphocytes Relative: 4 %
Lymphs Abs: 0.6 10*3/uL — ABNORMAL LOW (ref 1.0–3.6)
MCH: 28 pg (ref 26.0–34.0)
MCHC: 31.1 g/dL — AB (ref 32.0–36.0)
MCV: 90.1 fL (ref 80.0–100.0)
MONO ABS: 0.3 10*3/uL (ref 0.2–0.9)
Monocytes Relative: 2 %
Neutro Abs: 15 10*3/uL — ABNORMAL HIGH (ref 1.4–6.5)
Neutrophils Relative %: 93 %
PLATELETS: 158 10*3/uL (ref 150–440)
RBC: 3.51 MIL/uL — ABNORMAL LOW (ref 3.80–5.20)
RDW: 15.1 % — AB (ref 11.5–14.5)
Smear Review: ADEQUATE
WBC: 16.1 10*3/uL — ABNORMAL HIGH (ref 3.6–11.0)

## 2015-02-05 LAB — TRIGLYCERIDES: Triglycerides: 69 mg/dL (ref <150)

## 2015-02-05 LAB — LACTIC ACID, PLASMA
Lactic Acid, Venous: 7.4 mmol/L (ref 0.5–2.0)
Lactic Acid, Venous: 6 mmol/L (ref 0.5–2.0)

## 2015-02-05 MED ORDER — VASOPRESSIN 20 UNIT/ML IV SOLN
0.0300 [IU]/min | INTRAVENOUS | Status: DC
  Administered 2015-02-05: 0.03 [IU]/min via INTRAVENOUS
  Filled 2015-02-05: qty 2

## 2015-02-05 MED ORDER — SODIUM BICARBONATE 8.4 % IV SOLN
INTRAVENOUS | Status: DC
  Filled 2015-02-05 (×4): qty 50

## 2015-02-05 MED ORDER — DEXMEDETOMIDINE HCL IN NACL 200 MCG/50ML IV SOLN
0.4000 ug/kg/h | INTRAVENOUS | Status: DC
  Filled 2015-02-05: qty 50

## 2015-02-05 MED ORDER — SENNOSIDES-DOCUSATE SODIUM 8.6-50 MG PO TABS
1.0000 | ORAL_TABLET | Freq: Two times a day (BID) | ORAL | Status: DC
  Administered 2015-02-05 – 2015-02-06 (×3): 1
  Filled 2015-02-05 (×3): qty 1

## 2015-02-05 MED ORDER — FREE WATER
25.0000 mL | Status: DC
  Administered 2015-02-05: 25 mL

## 2015-02-05 MED ORDER — SODIUM BICARBONATE 8.4 % IV SOLN
INTRAVENOUS | Status: DC
  Administered 2015-02-05: 17:00:00 via INTRAVENOUS
  Filled 2015-02-05 (×3): qty 150

## 2015-02-05 MED ORDER — VITAL HIGH PROTEIN PO LIQD
1000.0000 mL | ORAL | Status: DC
  Administered 2015-02-05 – 2015-02-06 (×2): 1000 mL

## 2015-02-05 MED ORDER — FENTANYL CITRATE (PF) 100 MCG/2ML IJ SOLN: 100.0000 ug | INTRAMUSCULAR | Status: DC | PRN

## 2015-02-05 MED ORDER — INSULIN ASPART 100 UNIT/ML ~~LOC~~ SOLN
0.0000 [IU] | SUBCUTANEOUS | Status: DC
  Administered 2015-02-05: 2 [IU] via SUBCUTANEOUS
  Filled 2015-02-05: qty 2

## 2015-02-05 MED ORDER — PROPOFOL 1000 MG/100ML IV EMUL
0.0000 ug/kg/min | INTRAVENOUS | Status: DC
  Administered 2015-02-05: 5 ug/kg/min via INTRAVENOUS
  Administered 2015-02-06: 10 ug/kg/min via INTRAVENOUS
  Filled 2015-02-05 (×2): qty 100

## 2015-02-05 NOTE — Progress Notes (Signed)
Initial Nutrition Assessment  INTERVENTION:   1) EN: received verbal order from MD Ramachandran to start TF; recommend starting Vital High Protein. Per MD request, goal rate of 20 ml/hr today. Will assess goal rate to meet nutritional needs in AM if pt tolerating, will need to also take into account kcals being provided via driprivan. Also received order to start constipation prevention protocol  NUTRITION DIAGNOSIS:   Inadequate oral intake related to inability to eat, acute illness as evidenced by NPO status.   GOAL:   Provide needs based on ASPEN/SCCM guidelines   MONITOR:    (Energy Intake, EN, Digestive System, Anthropometrics, Electrolyte/Renal Profile, Glucose Profile)  REASON FOR ASSESSMENT:   Ventilator, Consult Enteral/tube feeding initiation and management  ASSESSMENT:   Pt with acute MI, acute respiratory failure requiring intubation, ARF, acute encephalopathy, hypotension, shock liver; currently on vent, on pressors  Past Medical History  Diagnosis Date  . Hypertension   . Asthma   . Coronary artery disease      Diet Order:   NPO  Electrolyte and Renal Profile:  Recent Labs Lab 02/04/15 0400 02/04/15 1345 02/04/15 2028 02/05/15 0504  BUN 25* 29* 32* 38*  CREATININE 1.93* 2.49* 2.65* 3.08*  NA 138 137 136 135  K 5.0 6.0* 4.0 3.8  MG 1.7  --   --   --    Glucose Profile:   Recent Labs  02/05/15 0435 02/05/15 0922 02/05/15 1128  GLUCAP 202* 176* 157*   Protein Profile:   Recent Labs Lab 01/22/2015 1157 02/04/15 1345 02/05/15 0504  ALBUMIN 3.2* 2.4* 2.3*   Nutritional Anemia Profile:  CBC Latest Ref Rng 02/05/2015 02/04/2015 02/04/2015  WBC 3.6 - 11.0 K/uL 16.1(H) 37.7(H) 22.9(H)  Hemoglobin 12.0 - 16.0 g/dL 1.6(F) 7.9(U) 10.6(L)  Hematocrit 35.0 - 47.0 % 31.6(L) 32.7(L) 35.6  Platelets 150 - 440 K/uL 158 184 216   Meds: started on diprivan, fentanyl pushes, ss novolog, bicarb drip, levophed, epinephrine   Skin:  Reviewed, no  issues  Last BM:   unknown  Nutrition Focused Physical Exam:  Unable to complete Nutrition-Focused physical exam at this time.    Height:   Ht Readings from Last 1 Encounters:  02/14/2015 5\' 2"  (1.575 m)    Weight:   Wt Readings from Last 1 Encounters:  02/05/15 175 lb 0.7 oz (79.4 kg)    Filed Weights   02/02/2015 1927 02/04/15 0703 02/05/15 0500  Weight: 163 lb 12.8 oz (74.3 kg) 175 lb 4.3 oz (79.5 kg) 175 lb 0.7 oz (79.4 kg)     Wt Readings from Last 10 Encounters:  02/05/15 175 lb 0.7 oz (79.4 kg)    BMI:  Body mass index is 32.01 kg/(m^2).  Estimated Nutritional Needs:   Kcal:  [phone] kcals (11-14 kcals/kg) ABW of 79.4 kg  Protein:  100-125 g (2.0-2.5 g/kg) using IBW 50 kg  Fluid:  1500-1750 mL (30-35 ml/kg)       HIGH Care Level  Romelle Starcher MS, RD, LDN [phone] Pager

## 2015-02-05 NOTE — Progress Notes (Signed)
MEDICATION RELATED CONSULT NOTE - INITIAL   Pharmacy Consult for Constipation prevention while intubated in ICU  Indication: ICU intubated  Allergies  Allergen Reactions  . Norvasc [Amlodipine Besylate] Swelling  . Vesicare [Solifenacin] Swelling  . Advair Diskus [Fluticasone-Salmeterol] Rash  . Sulfa Antibiotics Rash    Patient Measurements: Height: 5\' 2"  (157.5 cm) Weight: 175 lb 0.7 oz (79.4 kg) IBW/kg (Calculated) : 50.1 Adjusted Body Weight:   Vital Signs: Temp: 100.9 F (38.3 C) (07/21 0900) BP: 100/45 mmHg (07/21 0900) Pulse Rate: 95 (07/21 0900) Intake/Output from previous day: 07/20 0701 - 07/21 0700 In: 3325.5 [I.V.:3075.5; IV Piggyback:250] Out: 35 [Urine:35] Intake/Output from this shift: Total I/O In: 214.9 [I.V.:214.9] Out: -   Labs:  Recent Labs  02/06/2015 1157 02/04/15 0400 02/04/15 1345 02/04/15 2028 02/05/15 0504  WBC 6.7 22.9* 37.7*  --  16.1*  HGB 11.2* 10.6* 9.4*  --  9.8*  HCT 35.3 35.6 32.7*  --  31.6*  PLT 251 216 184  --  158  CREATININE 1.46* 1.93* 2.49* 2.65* 3.08*  MG  --  1.7  --   --   --   ALBUMIN 3.2*  --  2.4*  --  2.3*  PROT 6.5  --  5.0*  --  4.7*  AST 148*  --  >2275*  --  >2275*  ALT 84*  --  1343*  --  2210*  ALKPHOS 118  --  112  --  87  BILITOT 0.6  --  1.0  --  1.0   Estimated Creatinine Clearance: 12.6 mL/min (by C-G formula based on Cr of 3.08).   Microbiology: Recent Results (from the past 720 hour(s))  MRSA PCR Screening     Status: None   Collection Time: 01/27/2015 11:43 AM  Result Value Ref Range Status   MRSA by PCR NEGATIVE NEGATIVE Final    Comment:        The GeneXpert MRSA Assay (FDA approved for NASAL specimens only), is one component of a comprehensive MRSA colonization surveillance program. It is not intended to diagnose MRSA infection nor to guide or monitor treatment for MRSA infections.     Medical History: Past Medical History  Diagnosis Date  . Hypertension   . Asthma   .  Coronary artery disease     Medications:  Scheduled:  . antiseptic oral rinse  7 mL Mouth Rinse QID  . aspirin EC  81 mg Oral Daily  . chlorhexidine  15 mL Mouth Rinse BID  . feeding supplement (VITAL HIGH PROTEIN)  1,000 mL Per Tube Q24H  . heparin subcutaneous  5,000 Units Subcutaneous 3 times per day  . insulin aspart  0-9 Units Subcutaneous 6 times per day  . pantoprazole (PROTONIX) IV  40 mg Intravenous Q24H  . piperacillin-tazobactam (ZOSYN)  IV  3.375 g Intravenous Q12H  . pravastatin  40 mg Oral Daily  . senna-docusate  1 tablet Per Tube BID  . ticagrelor  90 mg Oral BID    Assessment: Patient currently mechanically ventilated requiring constipation prevention while intubated  Goal of Therapy:  Prevention of constipation  Plan:  Will start senna/docusate 1 tablet PO BID via tube. Will follow closely.   Talor Cheema D 02/05/2015,10:46 AM

## 2015-02-05 NOTE — Progress Notes (Signed)
[facility] Cardiology [facility] Encounter Note  Patient: Sophia Bradley / Admit Date: 02/18/2015 / Date of Encounter: 02/05/2015, 8:46 AM   Subjective: Patient is hemodynamically stable at this time after intubation after acute pulmonary edema likely secondary to ampullary muscle dysfunction and congestive heart failure from large myocardial infarction  Review of Systems: Unable to perform Objective: Telemetry: Sinus tachycardia Physical Exam: Blood pressure 98/49, pulse 95, temperature 100.6 F (38.1 C), temperature source Axillary, resp. rate 22, height 5\' 2"  (1.575 m), weight 175 lb 0.7 oz (79.4 kg), SpO2 99 %. Body mass index is 32.01 kg/(m^2). General: Well developed, well nourished, in no acute distress. Head: Normocephalic, atraumatic, sclera non-icteric, no xanthomas, nares are without discharge. Neck: No apparent masses Lungs: Respirator working well no apparent wheezes but some basilar crackles  Heart: Regular rate and rhythm, normal S1 S2, 2+ mitral murmur, no rub, no gallop, PMI is normal size and placement, carotid upstroke normal without bruit, jugular venous pressure normal Abdomen: Soft,  non-distended with normoactive bowel sounds. No hepatosplenomegaly. Abdominal aorta is normal size without bruit Extremities: Trace edema, no clubbing, no cyanosis, no ulcers,  Peripheral: 2+ radial, 2+ femoral, 2+ dorsal pedal pulses Neuro: Sedated   Intake/Output Summary (Last 24 hours) at 02/05/15 0846 Last data filed at 02/05/15 0511  Gross per 24 hour  Intake 3275.48 ml  Output     35 ml  Net 3240.48 ml    Inpatient Medications:  . antiseptic oral rinse  7 mL Mouth Rinse QID  . aspirin EC  81 mg Oral Daily  . chlorhexidine  15 mL Mouth Rinse BID  . heparin subcutaneous  5,000 Units Subcutaneous 3 times per day  . pantoprazole (PROTONIX) IV  40 mg Intravenous Q24H  . piperacillin-tazobactam (ZOSYN)  IV  3.375 g Intravenous Q12H  . pravastatin  40 mg Oral Daily  .  ticagrelor  90 mg Oral BID   Infusions:  . epinephrine 5.013 mcg/min (02/05/15 0511)  . norepinephrine (LEVOPHED) Adult infusion 40 mcg/min (02/05/15 0511)  .  sodium bicarbonate  infusion 1000 mL 125 mL/hr at 02/05/15 0511    Labs:  Recent Labs  02/04/15 0400  02/04/15 2028 02/05/15 0504  NA 138  < > 136 135  K 5.0  < > 4.0 3.8  CL 105  < > 103 100*  CO2 9*  < > 16* 20*  GLUCOSE 60*  < > 250* 233*  BUN 25*  < > 32* 38*  CREATININE 1.93*  < > 2.65* 3.08*  CALCIUM 7.7*  < > 6.7* 6.6*  MG 1.7  --   --   --   < > = values in this interval not displayed.  Recent Labs  02/04/15 1345 02/05/15 0504  AST >2275* >2275*  ALT 1343* 2210*  ALKPHOS 112 87  BILITOT 1.0 1.0  PROT 5.0* 4.7*  ALBUMIN 2.4* 2.3*    Recent Labs  2015-02-18 1157  02/04/15 1345 02/05/15 0504  WBC 6.7  < > 37.7* 16.1*  NEUTROABS 5.0  --   --  15.0*  HGB 11.2*  < > 9.4* 9.8*  HCT 35.3  < > 32.7* 31.6*  MCV 89.9  < > 98.5 90.1  PLT 251  < > 184 158  < > = values in this interval not displayed.  Recent Labs  Feb 18, 2015 1157 2015-02-18 1331 2015/02/18 2029 02/04/15 0359 02/04/15 1345  CKTOTAL 80  --   --   --   --   CKMB  --  5.8*  --   --   --   TROPONINI 0.03 0.18* >65.00* >65.00* >65.00*   Invalid input(s): POCBNP No results for input(s): HGBA1C in the last 72 hours.   Weights: Filed Weights   01/28/2015 1927 02/04/15 0703 02/05/15 0500  Weight: 163 lb 12.8 oz (74.3 kg) 175 lb 4.3 oz (79.5 kg) 175 lb 0.7 oz (79.4 kg)     Radiology/Studies:  Dg Chest 1 View  02/04/2015   CLINICAL DATA:  PICC line placement.  EXAM: CHEST  1 VIEW  COMPARISON:  Chest x-ray dated 01/17/2015.  FINDINGS: I do not see a right arm or left arm PICC line. There is a partially visualized line overlying the left lung apex which may have been inserted via the left internal jugular vein. If so, the line terminates in the expected region of the lower left internal jugular vein or left brachiocephalic vein.  Cardiomediastinal  silhouette is grossly stable in size and configuration. There is now diffuse bilateral interstitial edema. There is a probable small right pleural effusion blunting the right costophrenic angle. No pneumothorax.  IMPRESSION: No right arm or left arm PICC line identified. A partially visualized line overlying the left lung apex may have been inserted via the left internal jugular vein. If this is the line in question, its tip terminates in the expected region of the lower left internal jugular vein or left brachiocephalic vein and advancement would be recommended.  No pneumothorax seen.  New diffuse bilateral interstitial edema suggesting volume overload/ congestive heart failure.  Probable trace right pleural effusion.  Stable mild cardiomegaly.   Electronically Signed   By: Bary Richard M.D.   On: 02/04/2015 13:21   Dg Chest 1 View  01/22/2015   CLINICAL DATA:  Shortness of breath, near syncope  EXAM: CHEST  1 VIEW  COMPARISON:  Portable exam 1232 hours compared to 10/10/2012  FINDINGS: LEFT subclavian transvenous pacemaker with leads projecting over RIGHT atrium and RIGHT ventricle, unchanged.  Borderline enlargement of cardiac silhouette.  Atherosclerotic calcification aorta.  Mediastinal contours and pulmonary vascularity normal.  Lungs clear.  No pleural effusion or pneumothorax.  Bones demineralized with probable BILATERAL chronic rotator cuff tears.  Surgical clips in the RIGHT cervical region.  IMPRESSION: No acute abnormalities.   Electronically Signed   By: Ulyses Southward M.D.   On: 02/04/2015 12:43   Dg Abd 1 View  02/04/2015   CLINICAL DATA:  OG tube placement.  EXAM: ABDOMEN - 1 VIEW  COMPARISON:  09/20/2014  FINDINGS: The OG tube tip is in the distal stomach.  Normal bowel gas pattern.  No osseous abnormality.  IMPRESSION: OG tube tip in the distal stomach.   Electronically Signed   By: Francene Boyers M.D.   On: 02/04/2015 15:34     Assessment and Recommendation  81 y.o. female with the  paroxysmal nonvalvular atrial fibrillation and coronary artery disease with acute inferior non-ST elevation myocardial infarction Catheterization shows mild inferior hypokinesis with ejection fraction of 50% and ostial stenosis of right coronary artery status post successful PCI and stent placement of right coronary artery Acute pulmonary edema likely secondary to papillary muscle dysfunction from inferior myocardial infarction and severe mitral regurgitation causing hypoxia requirement for intubation 1. Continue Plavix and aspirin for further risk reduction of in-stent thrombosis of PCI and stent placement of ostial right coronary artery 2. Continue intubation and oxygenation for 24 more hours at least until further evaluation by echocardiogram of LV systolic dysfunction and papillary muscle dysfunction  3. Continue pulmonary care with critical care team and appreciated 4. Further diagnostic testing and treatment options after above  Signed, Arnoldo Hooker M.D. FACC

## 2015-02-05 NOTE — Assessment & Plan Note (Addendum)
--  Presenting problem, s/p cath and stent of RCA, cardiology following.

## 2015-02-05 NOTE — Progress Notes (Addendum)
   02/05/15 1000  Clinical Encounter Type  Visited With Family  Visit Type Follow-up  Spiritual Encounters  Spiritual Needs Prayer  Stress Factors  Patient Stress Factors Health changes  Family Stress Factors Health changes   Status: nonverbal, Stemi Age/Sex: female 50yrs Family: daughter and family friend Update: Chaplain spoke with the patient's daughter and a family friend earlier. The daughter shared that she was pleased that her mother is responding, moving her eyes and hands. Chaplains will continue to pray for comfort and healing for Mrs. Phillippi and a speedy recovery. The daughter also shared that her mother called the ambulance yesterday herself.  Chaplains and pastoral care can be reached via pager at [phone] and by submitting an online request

## 2015-02-05 NOTE — Progress Notes (Signed)
Pt remains intubated on assist control 15/450/70%5 peep. Pt has poor UOP in foley. Pt became more aroused opening eyes to  Her name , but  Did not follow commands. Pt off bair hugger at this point , cont . On levo/epi .

## 2015-02-05 NOTE — Assessment & Plan Note (Signed)
--  Uncertain etiology, s/p STEMI and cath.

## 2015-02-05 NOTE — Assessment & Plan Note (Addendum)
--  Likely due to ATN from hypotensive episode in code.  --Acidosis improved, bicarb drip decreased to 50cc/hr, conservative fluids.

## 2015-02-05 NOTE — Progress Notes (Addendum)
ANTIBIOTIC CONSULT NOTE - FOLLOW UP   Pharmacy Consult for Vancomycin and Zosyn  Indication: rule out sepsis  Allergies  Allergen Reactions  . Norvasc [Amlodipine Besylate] Swelling  . Vesicare [Solifenacin] Swelling  . Advair Diskus [Fluticasone-Salmeterol] Rash  . Sulfa Antibiotics Rash    Patient Measurements: Height: 5\' 2"  (157.5 cm) Weight: 175 lb 0.7 oz (79.4 kg) IBW/kg (Calculated) : 50.1   Vital Signs: Temp: 100.6 F (38.1 C) (07/21 0800) BP: 98/49 mmHg (07/21 0800) Pulse Rate: 95 (07/21 0800) Intake/Output from previous day: 07/20 0701 - 07/21 0700 In: 3275.5 [I.V.:3075.5; IV Piggyback:200] Out: 35 [Urine:35] Intake/Output from this shift:    Labs:  Recent Labs  02/04/15 0400 02/04/15 1345 02/04/15 2028 02/05/15 0504  WBC 22.9* 37.7*  --  16.1*  HGB 10.6* 9.4*  --  9.8*  PLT 216 184  --  158  CREATININE 1.93* 2.49* 2.65* 3.08*   Estimated Creatinine Clearance: 12.6 mL/min (by C-G formula based on Cr of 3.08).  Recent Labs  02/04/15 2028  [facility] 15     Microbiology: Recent Results (from the past 720 hour(s))  MRSA PCR Screening     Status: None   Collection Time: Feb 27, 2015 11:43 AM  Result Value Ref Range Status   MRSA by PCR NEGATIVE NEGATIVE Final    Comment:        The GeneXpert MRSA Assay (FDA approved for NASAL specimens only), is one component of a comprehensive MRSA colonization surveillance program. It is not intended to diagnose MRSA infection nor to guide or monitor treatment for MRSA infections.     Medical History: Past Medical History  Diagnosis Date  . Hypertension   . Asthma   . Coronary artery disease     Medications:  Scheduled:  . antiseptic oral rinse  7 mL Mouth Rinse QID  . aspirin EC  81 mg Oral Daily  . chlorhexidine  15 mL Mouth Rinse BID  . heparin subcutaneous  5,000 Units Subcutaneous 3 times per day  . pantoprazole (PROTONIX) IV  40 mg Intravenous Q24H  . piperacillin-tazobactam (ZOSYN)  IV   3.375 g Intravenous Q12H  . pravastatin  40 mg Oral Daily  . ticagrelor  90 mg Oral BID   Infusions:  . epinephrine 5.013 mcg/min (02/05/15 0511)  . norepinephrine (LEVOPHED) Adult infusion 40 mcg/min (02/05/15 0511)  .  sodium bicarbonate  infusion 1000 mL 125 mL/hr at 02/05/15 0511   PRN: acetaminophen, nitroGLYCERIN, ondansetron (ZOFRAN) IV  Assessment: 81 y/o F admitted with SOB, STEMI, elevated lactic acid level ordered empiric abx for r/o sepsis. Patient with elevated SCR, AKI so level was ordered subsequent to the first dose. The level is virtually meaningless except to r/o toxicity as this would be nowhere near steady state.   7/21: Patient's renal function continues to diminish. Currently serum creatinine is 3.0 mg/dl;    Goal of Therapy:  Vancomycin trough level 15-20 mcg/ml  Plan:  Random level ordered to be drawn ~48 hours post dose to assess for toxicity. Patient likely will need to be dosed per levels based on renal function decline. Vancomycin 750 mg IV q48 hours dose has been discontinued. Will need to follow up and restart based on results of random level. Scr ordered with am labs.   Zosyn: Will continue Zosyn 3.375 g IV q12 hours.    Vanna Sailer D 02/05/2015,8:42 AM

## 2015-02-05 NOTE — Assessment & Plan Note (Signed)
--  Currently on epinephrine and levophed drips. Will wean epi drip today to MAP > 60.

## 2015-02-05 NOTE — Assessment & Plan Note (Signed)
--  Due to cardiac arrest, mental status appears to be improving. Continue pain and sedation meds as needed, wean as tolerated.

## 2015-02-05 NOTE — Progress Notes (Signed)
At approximately 20:30, pt's bp was not being maintained by current pressor.  Vasopressin was added by Dr. Imogene Burn, bp stabilized.  Pt was also in afib.  Dr. Darrold Junker was notified.  Per Dr. Darrold Junker, continue to monitor pt.  No further changes made at this time.

## 2015-02-05 NOTE — Progress Notes (Signed)
CRITICAL CARE MEDICINE: ICU PROGRESS NOTE   Name: Sophia Bradley MRN: [id number] DOB: [birth year]     ASSESSMENT / PLAN: Acute respiratory failure --Currently vent dependent, intubated during code.  --Will wean vent as mental status improves.   Cardiac arrest --Uncertain etiology, s/p STEMI and cath.   Myocardial infarction acute --Presenting problem, s/p cath and stent of RCA, cardiology following.   Acute renal failure --Likely due to ATN from hypotensive episode in code.  --Acidosis improved, bicarb drip decreased to 50cc/hr, conservative fluids.   Acute encephalopathy --Due to cardiac arrest, mental status appears to be improving. Continue pain and sedation meds as needed, wean as tolerated.   Hypotension --Currently on epinephrine and levophed drips. Will wean epi drip today to MAP > 60.   Shock liver --secondary to cardiac arrest, will continue to monitor.    ADMISSION DATE:  02/01/2015  CHIEF COMPLAINT:  MI   STUDIES:  CXR image, report reviewed; lines and ETT in position.    SUBJECTIVE:   Pt currently on the ventilator, can not provide history or review of systems.   VITAL SIGNS: Temp:  [93.7 F (34.3 C)-100.9 F (38.3 C)] 100.9 F (38.3 C) (07/21 0900) Pulse Rate:  [60-96] 95 (07/21 0900) Resp:  [13-24] 24 (07/21 0900) BP: (39-134)/(20-98) 100/45 mmHg (07/21 0900) SpO2:  [92 %-100 %] 99 % (07/21 0900) FiO2 (%):  [50 %-70 %] 50 % (07/21 0900) Weight:  [79.4 kg (175 lb 0.7 oz)] 79.4 kg (175 lb 0.7 oz) (07/21 0500) HEMODYNAMICS:   VENTILATOR SETTINGS: Vent Mode:  [-] PRVC FiO2 (%):  [50 %-70 %] 50 % Set Rate:  [15 bmp] 15 bmp Vt Set:  [450 mL] 450 mL PEEP:  [5 cmH20] 5 cmH20 INTAKE / OUTPUT:  Intake/Output Summary (Last 24 hours) at 02/05/15 1121 Last data filed at 02/05/15 0900  Gross per 24 hour  Intake 3540.36 ml  Output     35 ml  Net 3505.36 ml    PHYSICAL EXAMINATION: Physical Examination:   VS: BP 100/45 mmHg  Pulse 95   Temp(Src) 100.9 F (38.3 C) (Axillary)  Resp 24  Ht  (1.575 m)  Wt 79.4 kg (175 lb 0.7 oz)  BMI 32.01 kg/m2  SpO2 99%  General Appearance: No distress  Neuro:without focal findings. HEENT: PERRLA, EOM intact, no ptosis, no other lesions noticed;  Pulmonary: normal breath sounds., diaphragmatic excursion normal.No wheezing, No rales;   Sputum Production:   CardiovascularNormal S1,S2.  No m/r/g.  Abdominal aorta pulsation normal.    Abdomen: Benign, Soft, non-tender, No masses, hepatosplenomegaly, No lymphadenopathy Renal:  No costovertebral tenderness  GU:  No performed at this time. Endoc: No evident thyromegaly, no signs of acromegaly or Cushing features Skin:   warm, no rashes, no ecchymosis  Extremities: normal, no cyanosis, clubbing, no edema, warm with normal capillary refill.    LABS:  CBC  Recent Labs Lab 02/04/15 0400 02/04/15 1345 02/05/15 0504  WBC 22.9* 37.7* 16.1*  HGB 10.6* 9.4* 9.8*  HCT 35.6 32.7* 31.6*  PLT 216 184 158   Coag's No results for input(s): APTT, INR in the last 168 hours. BMET  Recent Labs Lab 02/04/15 1345 02/04/15 2028 02/05/15 0504  NA 137 136 135  K 6.0* 4.0 3.8  CL 106 103 100*  CO2 10* 16* 20*  BUN 29* 32* 38*  CREATININE 2.49* 2.65* 3.08*  GLUCOSE 151* 250* 233*   Electrolytes  Recent Labs Lab 02/04/15 0400 02/04/15 1345 02/04/15 2028 02/05/15 6295  CALCIUM 7.7* 7.0* 6.7* 6.6*  MG 1.7  --   --   --    Sepsis Markers  Recent Labs Lab 02/06/2015 2029 02/05/15 0140 02/05/15 0504  LATICACIDVEN 8.0* 7.4* 6.0*   ABG  Recent Labs Lab 02/04/15 1425 02/05/15 0440  PHART 6.86* 7.28*  PCO2ART 42 38  PO2ART 91 127*   Liver Enzymes  Recent Labs Lab 01/29/2015 1157 02/04/15 1345 02/05/15 0504  AST 148* >2275* >2275*  ALT 84* 1343* 2210*  ALKPHOS 118 112 87  BILITOT 0.6 1.0 1.0  ALBUMIN 3.2* 2.4* 2.3*   Cardiac Enzymes  Recent Labs Lab 02/06/2015 2029 02/04/15 0359 02/04/15 1345  TROPONINI >65.00*  >65.00* >65.00*   Glucose  Recent Labs Lab 02/11/2015 1907 02/04/15 1152 02/04/15 1213 02/04/15 2347 02/05/15 0435 02/05/15 0922  GLUCAP 111* 42* 163* 217* 202* 176*    Imaging Dg Chest 1 View  02/04/2015   CLINICAL DATA:  PICC line placement.  EXAM: CHEST  1 VIEW  COMPARISON:  Chest x-ray dated 01/19/2015.  FINDINGS: I do not see a right arm or left arm PICC line. There is a partially visualized line overlying the left lung apex which may have been inserted via the left internal jugular vein. If so, the line terminates in the expected region of the lower left internal jugular vein or left brachiocephalic vein.  Cardiomediastinal silhouette is grossly stable in size and configuration. There is now diffuse bilateral interstitial edema. There is a probable small right pleural effusion blunting the right costophrenic angle. No pneumothorax.  IMPRESSION: No right arm or left arm PICC line identified. A partially visualized line overlying the left lung apex may have been inserted via the left internal jugular vein. If this is the line in question, its tip terminates in the expected region of the lower left internal jugular vein or left brachiocephalic vein and advancement would be recommended.  No pneumothorax seen.  New diffuse bilateral interstitial edema suggesting volume overload/ congestive heart failure.  Probable trace right pleural effusion.  Stable mild cardiomegaly.   Electronically Signed   By: Bary Richard M.D.   On: 02/04/2015 13:21   Dg Abd 1 View  02/04/2015   CLINICAL DATA:  OG tube placement.  EXAM: ABDOMEN - 1 VIEW  COMPARISON:  09/20/2014  FINDINGS: The OG tube tip is in the distal stomach.  Normal bowel gas pattern.  No osseous abnormality.  IMPRESSION: OG tube tip in the distal stomach.   Electronically Signed   By: Francene Boyers M.D.   On: 02/04/2015 15:34     Deep Nicholos Johns, MD [facility] Pager - [phone] Critical Care Attestation.  I have  personally obtained a history, examined the patient, evaluated laboratory and imaging results, formulated the assessment and plan and placed orders. The Patient requires high complexity decision making for assessment and support, frequent evaluation and titration of therapies, application of advanced monitoring technologies and extensive interpretation of multiple databases. The patient has critical illness that could lead imminently to failure of 1 or more organ systems and requires the highest level of physician preparedness to intervene.  Critical Care Time devoted to patient care services described in this note is 35 minutes and is exclusive of time spent in procedures.

## 2015-02-05 NOTE — Assessment & Plan Note (Signed)
--  secondary to cardiac arrest, will continue to monitor.   

## 2015-02-05 NOTE — Progress Notes (Signed)
*  PRELIMINARY RESULTS* Echocardiogram 2D Echocardiogram has been performed.  Georgann Housekeeper Hege 02/05/2015, 2:42 PM

## 2015-02-05 NOTE — Progress Notes (Signed)
Inpatient Diabetes Program Recommendations  AACE/ADA: New Consensus Statement on Inpatient Glycemic Control (2013)  Target Ranges:  Prepandial:   less than 140 mg/dL      Peak postprandial:   less than 180 mg/dL (1-2 hours)      Critically ill patients:  140 - 180 mg/dL   Results for Sophia Bradley, Sophia Bradley (MRN [id number]) as of 02/05/2015 09:27  Ref. Range 02/04/2015 11:52 02/04/2015 12:13 02/04/2015 23:47 02/05/2015 04:35 02/05/2015 09:22  Glucose-Capillary Latest Ref Range: 65-99 mg/dL 42 (LL) 226 (H) 333 (H) 202 (H) 176 (H)    Reason for assessment: elevated CBG  Diabetes history: none noted Outpatient Diabetes medications: none Current orders for Inpatient glycemic control: none   Consider starting the patient on ICU Glycemic Control Order set Phase 1 with sensitive correction  Susette Racer, RN, BA, MHA, CDE Diabetes Coordinator Inpatient Diabetes Program  [phone] (Team Pager) [phone] [facility] Office) 02/05/2015 9:33 AM

## 2015-02-05 NOTE — Progress Notes (Signed)
[facility] [place] at [facility]   PATIENT NAME: Sophia Bradley    MR#:  [id number]  DATE OF BIRTH:  04/02/1926  SUBJECTIVE: Seen today, critically ill ,on full vent support,on levaphed,epinephrine  Drip .  CHIEF COMPLAINT:   Chief Complaint  Patient presents with  . Shortness of Breath   T REVIEW OF SYSTEMS:  unable to obtain. Due to intubation and sedation.  DRUG ALLERGIES:   Allergies  Allergen Reactions  . Norvasc [Amlodipine Besylate] Swelling  . Vesicare [Solifenacin] Swelling  . Advair Diskus [Fluticasone-Salmeterol] Rash  . Sulfa Antibiotics Rash    VITALS:  Blood pressure 100/45, pulse 95, temperature 100.9 F (38.3 C), temperature source Axillary, resp. rate 24, height 5\' 2"  (1.575 m), weight 79.4 kg (175 lb 0.7 oz), SpO2 99 %.  PHYSICAL EXAMINATION:  GENERAL:  81 y.o.-year-old patient lying in the bed, intubated and on Ventilation. EYES: Pupils equal, round, reactive to light and accommodation. No scleral icterus. Extraocular muscles intact.  HEENT: Head atraumatic, normocephalic. On intubation and ventilation.  NECK:  Supple, no jugular venous distention. No thyroid enlargement, no tenderness.  LUNGS: Normal breath sounds bilaterally, no wheezing, or lateral rales.  CARDIOVASCULAR: S1, S2 normal. No murmurs, rubs, or gallops.  ABDOMEN: Soft, nontender, nondistended. Bowel sounds present. No organomegaly or mass.  EXTREMITIES: No pedal edema, cyanosis, or clubbing.  NEUROLOGIC: Unresponsive on ventilation. PSYCHIATRIC: Unresponsive on ventilation. SKIN: No obvious rash, lesion, or ulcer.    LABORATORY PANEL:   CBC  Recent Labs Lab 02/05/15 0504  WBC 16.1*  HGB 9.8*  HCT 31.6*  PLT 158   ------------------------------------------------------------------------------------------------------------------  Chemistries   Recent Labs Lab 02/04/15 0400  02/05/15 0504  NA 138  < > 135  K 5.0  < > 3.8  CL 105  < > 100*   CO2 9*  < > 20*  GLUCOSE 60*  < > 233*  BUN 25*  < > 38*  CREATININE 1.93*  < > 3.08*  CALCIUM 7.7*  < > 6.6*  MG 1.7  --   --   AST  --   < > >2275*  ALT  --   < > 2210*  ALKPHOS  --   < > 87  BILITOT  --   < > 1.0  < > = values in this interval not displayed. ------------------------------------------------------------------------------------------------------------------  Cardiac Enzymes  Recent Labs Lab 02/04/15 1345  TROPONINI >65.00*   ------------------------------------------------------------------------------------------------------------------  RADIOLOGY:  Dg Chest 1 View  02/04/2015   CLINICAL DATA:  PICC line placement.  EXAM: CHEST  1 VIEW  COMPARISON:  Chest x-ray dated 02/27/2015.  FINDINGS: I do not see a right arm or left arm PICC line. There is a partially visualized line overlying the left lung apex which may have been inserted via the left internal jugular vein. If so, the line terminates in the expected region of the lower left internal jugular vein or left brachiocephalic vein.  Cardiomediastinal silhouette is grossly stable in size and configuration. There is now diffuse bilateral interstitial edema. There is a probable small right pleural effusion blunting the right costophrenic angle. No pneumothorax.  IMPRESSION: No right arm or left arm PICC line identified. A partially visualized line overlying the left lung apex may have been inserted via the left internal jugular vein. If this is the line in question, its tip terminates in the expected region of the lower left internal jugular vein or left brachiocephalic vein and advancement would be recommended.  No pneumothorax  seen.  New diffuse bilateral interstitial edema suggesting volume overload/ congestive heart failure.  Probable trace right pleural effusion.  Stable mild cardiomegaly.   Electronically Signed   By: Bary Richard M.D.   On: 02/04/2015 13:21   Dg Chest 1 View  01/16/2015   CLINICAL DATA:  Shortness  of breath, near syncope  EXAM: CHEST  1 VIEW  COMPARISON:  Portable exam 1232 hours compared to 10/10/2012  FINDINGS: LEFT subclavian transvenous pacemaker with leads projecting over RIGHT atrium and RIGHT ventricle, unchanged.  Borderline enlargement of cardiac silhouette.  Atherosclerotic calcification aorta.  Mediastinal contours and pulmonary vascularity normal.  Lungs clear.  No pleural effusion or pneumothorax.  Bones demineralized with probable BILATERAL chronic rotator cuff tears.  Surgical clips in the RIGHT cervical region.  IMPRESSION: No acute abnormalities.   Electronically Signed   By: Ulyses Southward M.D.   On: 02/08/2015 12:43   Dg Abd 1 View  02/04/2015   CLINICAL DATA:  OG tube placement.  EXAM: ABDOMEN - 1 VIEW  COMPARISON:  09/20/2014  FINDINGS: The OG tube tip is in the distal stomach.  Normal bowel gas pattern.  No osseous abnormality.  IMPRESSION: OG tube tip in the distal stomach.   Electronically Signed   By: Francene Boyers M.D.   On: 02/04/2015 15:34    EKG:   Orders placed or performed during the hospital encounter of 01/24/2015  . EKG 12-Lead immediately post procedure  . EKG 12-Lead  . EKG 12-Lead immediately post procedure  . EKG 12-Lead  . EKG 12-Lead  . EKG 12-Lead  . EKG 12-Lead  . EKG 12-Lead  . EKG 12-Lead  . EKG 12-Lead  . EKG 12-Lead  . EKG 12-Lead  . EKG 12-Lead  . EKG 12-Lead    ASSESSMENT AND PLAN:   * Acute respiratory failure with hypoxia. Status post intubation. Continue ventilation and nebulizer treatment,  follow-up pulmonary physician. Acute pulmonary pulmonary edema causing the respiratory failure and cardiac arrest DUE to papillary muscle dysfunction in the contest of injury wall MI., Severe mitral regurgitation. Repeat echo is done and results are pending.  *Hypotension. Possible cardiac etiology due to MI. Discontinue normal saline and start Levophed drip. Minimal the epinephrine drip as possible.   * Possible aspiration pneumonia continue  Zosyn, follow serial x-rays.* Hypoglycemia. The patient was treated with the D50 one dose.  #1 Non- ST elevation MI. Troponin>65. S/p PCI, to RCA, continue aspirin, ticagrelor, pravachol.  #3. ARF likely due to ATN, worsening renal function, I likely secondary to contrast from ardiac cath catheter as well. On bicarbonate drip continue that, obtain nephrology consult.  Prognosis poor ,condition critical  Shock liver with elevated LFTs follow closely, monitor daily LFTs. Dr. liver secondary to cardiac arrest She wants the patient on full code for now but she doesn't want patient on long-term life support.  All the records are reviewed and case discussed with Care Management/Social Workerr. Management plans discussed with the patient, family and they are in agreement.  CODE STATUS: Full code at this time.  Additional CRITICAL TIME TAKING CARE OF THIS PATIENT: 46 minutes.   POSSIBLE D/C IN unknown DAYS, DEPENDING ON CLINICAL CONDITION.   Katha Hamming M.D on 02/05/2015 at 12:17 PM  Between 7am to 6pm - Pager - [phone]  After 6pm go to [email or web address] - password [email or web address] [facility]  [facility]  Office  [phone]  CC: Primary care physician; Jerl Mina, MD

## 2015-02-05 NOTE — Care Management Important Message (Signed)
Important Message  Patient Details  Name: Sophia Bradley MRN: [id number] Date of Birth: 05/26/1926   Medicare Important Message Given:  Yes-second notification given    Verita Schneiders Allmond 02/05/2015, 9:36 AM

## 2015-02-05 NOTE — Assessment & Plan Note (Addendum)
--  Currently vent dependent, intubated during code.  --Will wean vent as mental status improves.    

## 2015-02-06 ENCOUNTER — Inpatient Hospital Stay: Payer: Medicare Other

## 2015-02-06 DIAGNOSIS — G9340 Encephalopathy, unspecified: Secondary | ICD-10-CM

## 2015-02-06 DIAGNOSIS — R6521 Severe sepsis with septic shock: Secondary | ICD-10-CM

## 2015-02-06 DIAGNOSIS — N179 Acute kidney failure, unspecified: Secondary | ICD-10-CM

## 2015-02-06 DIAGNOSIS — A419 Sepsis, unspecified organism: Secondary | ICD-10-CM

## 2015-02-06 LAB — COMPREHENSIVE METABOLIC PANEL
ALT: 2139 U/L — ABNORMAL HIGH (ref 14–54)
AST: >2275 U/L — ABNORMAL HIGH (ref 15–41)
Albumin: 2.2 g/dL — ABNORMAL LOW (ref 3.5–5.0)
Alkaline Phosphatase: 94 U/L (ref 38–126)
Anion gap: 14 (ref 5–15)
BILIRUBIN TOTAL: 1.7 mg/dL — AB (ref 0.3–1.2)
BUN: 50 mg/dL — AB (ref 6–20)
CO2: 23 mmol/L (ref 22–32)
CREATININE: 3.94 mg/dL — AB (ref 0.44–1.00)
Calcium: 6.1 mg/dL — CL (ref 8.9–10.3)
Chloride: 93 mmol/L — ABNORMAL LOW (ref 101–111)
GFR calc Af Amer: 11 mL/min — ABNORMAL LOW (ref >60)
GFR calc non Af Amer: 9 mL/min — ABNORMAL LOW (ref >60)
GLUCOSE: 132 mg/dL — AB (ref 65–99)
Potassium: 4.5 mmol/L (ref 3.5–5.1)
SODIUM: 130 mmol/L — AB (ref 135–145)
TOTAL PROTEIN: 4.5 g/dL — AB (ref 6.5–8.1)

## 2015-02-06 LAB — BLOOD GAS, ARTERIAL
ACID-BASE DEFICIT: 2 mmol/L (ref 0.0–2.0)
Bicarbonate: 20.4 mEq/L — ABNORMAL LOW (ref 21.0–28.0)
FIO2: 0.5 %
MECHVT: 450 mL
O2 SAT: 95.8 %
PCO2 ART: 28 mmHg — AB (ref 32.0–48.0)
PEEP: 5 cmH2O
Patient temperature: 37
RATE: 15 resp/min
pH, Arterial: 7.47 — ABNORMAL HIGH (ref 7.350–7.450)
pO2, Arterial: 75 mmHg — ABNORMAL LOW (ref 83.0–108.0)

## 2015-02-06 LAB — CBC WITH DIFFERENTIAL/PLATELET
Band Neutrophils: 18 % — ABNORMAL HIGH (ref 0–10)
Basophils Absolute: 0 10*3/uL (ref 0.0–0.1)
Basophils Relative: 0 % (ref 0–1)
Blasts: 0 %
Eosinophils Absolute: 0 10*3/uL (ref 0.0–0.7)
Eosinophils Relative: 0 % (ref 0–5)
HEMATOCRIT: 28.5 % — AB (ref 35.0–47.0)
Hemoglobin: 9.4 g/dL — ABNORMAL LOW (ref 12.0–16.0)
LYMPHS PCT: 3 % — AB (ref 12–46)
Lymphs Abs: 0.4 10*3/uL — ABNORMAL LOW (ref 0.7–4.0)
MCH: 28.8 pg (ref 26.0–34.0)
MCHC: 32.9 g/dL (ref 32.0–36.0)
MCV: 87.6 fL (ref 80.0–100.0)
MYELOCYTES: 0 %
Metamyelocytes Relative: 0 %
Monocytes Absolute: 0.6 10*3/uL (ref 0.1–1.0)
Monocytes Relative: 4 % (ref 3–12)
NEUTROS PCT: 75 % (ref 43–77)
Neutro Abs: 12.9 10*3/uL — ABNORMAL HIGH (ref 1.7–7.7)
Other: 0 %
PROMYELOCYTES ABS: 0 %
Platelets: 135 10*3/uL — ABNORMAL LOW (ref 150–440)
RBC: 3.25 MIL/uL — AB (ref 3.80–5.20)
RDW: 14.9 % — AB (ref 11.5–14.5)
SMEAR REVIEW: ADEQUATE
WBC: 13.9 10*3/uL — ABNORMAL HIGH (ref 3.6–11.0)
nRBC: 2 /100 WBC — ABNORMAL HIGH

## 2015-02-06 LAB — C DIFFICILE QUICK SCREEN W PCR REFLEX
C DIFFICILE (CDIFF) TOXIN: NEGATIVE
C Diff antigen: NEGATIVE
C Diff interpretation: NEGATIVE

## 2015-02-06 LAB — GLUCOSE, CAPILLARY
Glucose-Capillary: 100 mg/dL — ABNORMAL HIGH (ref 65–99)
Glucose-Capillary: 106 mg/dL — ABNORMAL HIGH (ref 65–99)
Glucose-Capillary: 106 mg/dL — ABNORMAL HIGH (ref 65–99)
Glucose-Capillary: 112 mg/dL — ABNORMAL HIGH (ref 65–99)
Glucose-Capillary: 81 mg/dL (ref 65–99)
Glucose-Capillary: 84 mg/dL (ref 65–99)

## 2015-02-06 LAB — EXPECTORATED SPUTUM ASSESSMENT W REFEX TO RESP CULTURE

## 2015-02-06 LAB — EXPECTORATED SPUTUM ASSESSMENT W GRAM STAIN, RFLX TO RESP C

## 2015-02-06 LAB — OCCULT BLOOD X 1 CARD TO LAB, STOOL: Fecal Occult Bld: POSITIVE — AB

## 2015-02-06 MED ORDER — SENNOSIDES-DOCUSATE SODIUM 8.6-50 MG PO TABS: 1.0000 | ORAL_TABLET | Freq: Every evening | ORAL | Status: DC | PRN

## 2015-02-06 MED ORDER — SODIUM CHLORIDE 0.9 % IV SOLN
INTRAVENOUS | Status: DC
  Administered 2015-02-06: 11:00:00 via INTRAVENOUS

## 2015-02-06 MED ORDER — CALCIUM GLUCONATE 10 % IV SOLN
1.0000 g | Freq: Once | INTRAVENOUS | Status: AC
Start: 2015-02-06 — End: 2015-02-06
  Administered 2015-02-06: 1 g via INTRAVENOUS
  Filled 2015-02-06: qty 10

## 2015-02-06 MED FILL — Medication: Qty: 1 | Status: AC

## 2015-02-06 NOTE — Assessment & Plan Note (Signed)
--  Acidosis improved, stop bicarb drip, and change to normal saline.  --Discussed with Dr. Cherylann Ratel, will initial CRRT.

## 2015-02-06 NOTE — Assessment & Plan Note (Signed)
--  Possible sepsis complicating cardiac arrest.  --Had fevers overnight, will add vanco and pan-culture.

## 2015-02-06 NOTE — Progress Notes (Signed)
PHARMACY - CRITICAL CARE PROGRESS NOTE  Pharmacy Consult for Electrolytes Indication: electrolytes   Allergies  Allergen Reactions  . Norvasc [Amlodipine Besylate] Swelling  . Vesicare [Solifenacin] Swelling  . Advair Diskus [Fluticasone-Salmeterol] Rash  . Sulfa Antibiotics Rash    Patient Measurements: Height: 5\' 2"  (157.5 cm) Weight: 180 lb 12.4 oz (82 kg) IBW/kg (Calculated) : 50.1 Adjusted Body Weight:   Vital Signs: Temp: 97.2 F (36.2 C) (07/22 0500) Temp Source: Rectal (07/22 0500) BP: 104/67 mmHg (07/22 0500) Pulse Rate: 108 (07/22 0500) Intake/Output from previous day: 07/21 0701 - 07/22 0700 In: 1650.6 [I.V.:1315.3; NG/GT:285.3; IV Piggyback:50] Out: 125 [Urine:125] Intake/Output from this shift: Total I/O In: 498.2 [I.V.:438.2; NG/GT:60] Out: 125 [Urine:125] Vent settings for last 24 hours: Vent Mode:  [-] PRVC FiO2 (%):  [40 %-50 %] 40 % Set Rate:  [15 bmp] 15 bmp Vt Set:  [450 mL] 450 mL PEEP:  [5 cmH20] 5 cmH20  Labs:  Recent Labs  02/04/15 0400 02/04/15 1345 02/04/15 2028 02/05/15 0504 02/06/15 0434  WBC 22.9* 37.7*  --  16.1* 13.9*  HGB 10.6* 9.4*  --  9.8* 9.4*  HCT 35.6 32.7*  --  31.6* 28.5*  PLT 216 184  --  158 135*  CREATININE 1.93* 2.49* 2.65* 3.08* 3.94*  MG 1.7  --   --   --   --   ALBUMIN  --  2.4*  --  2.3* 2.2*  PROT  --  5.0*  --  4.7* 4.5*  AST  --  >2275*  --  >2275* >2275*  ALT  --  1343*  --  2210* 2139*  ALKPHOS  --  112  --  87 94  BILITOT  --  1.0  --  1.0 1.7*   Estimated Creatinine Clearance: 10 mL/min (by C-G formula based on Cr of 3.94).   Recent Labs  02/05/15 1952 02/05/15 2353 02/06/15 0347  GLUCAP 100* 112* 112*    Microbiology: Recent Results (from the past 720 hour(s))  MRSA PCR Screening     Status: None   Collection Time: 01/17/2015 11:43 AM  Result Value Ref Range Status   MRSA by PCR NEGATIVE NEGATIVE Final    Comment:        The GeneXpert MRSA Assay (FDA approved for NASAL  specimens only), is one component of a comprehensive MRSA colonization surveillance program. It is not intended to diagnose MRSA infection nor to guide or monitor treatment for MRSA infections.     Medications:   Assessment: Ca = 6.1,  Albumin = 2.2  Corrected Ca = 7.54 mg/dL   Goal of Therapy:  Normalization of calcium   Plan:  Calcium gluconate 1 gm IV X 1 bolus ordered.   Adelita Hone D 02/06/2015,6:00 AM

## 2015-02-06 NOTE — Progress Notes (Signed)
Nutrition Follow-up    INTERVENTION:   1) EN: if remains intubated, recommend continuing TF; continue current rate for now. Will assess goal rate to meet nutritional needs if remains on vent over the weekend, if continues to tolerate TF and hemodynamically stable  NUTRITION DIAGNOSIS:   Inadequate oral intake related to inability to eat, acute illness as evidenced by NPO status.  GOAL:   Provide needs based on ASPEN/SCCM guidelines  MONITOR:    (Energy Intake, EN, Digestive System, Anthropometrics, Electrolyte/Renal Profile, Glucose Profile)  ASSESSMENT:    Pt remains on vent, requiring 3 pressors over night, currently off of vasopressin, on levophed and epinephrine; per discussion during ICU rounds, plan for possible terminal extubation later today once family arrives  EN: tolerating Vital High Protein at rate of 20 ml/hr  Digestive System: no signs of TF intolerance  Urine volume: poor UOP, 125 mL in 24 hours  Skin:  Reviewed, no issues  Last BM:   + 3 loose stools, Cdiff pending  Electrolyte and Renal Profile:  Recent Labs Lab 02/04/15 0400  02/04/15 2028 02/05/15 0504 02/06/15 0434  BUN 25*  < > 32* 38* 50*  CREATININE 1.93*  < > 2.65* 3.08* 3.94*  NA 138  < > 136 135 130*  K 5.0  < > 4.0 3.8 4.5  MG 1.7  --   --   --   --   < > = values in this interval not displayed.  Glucose Profile:  Recent Labs  02/06/15 0347 02/06/15 0755 02/06/15 1205  GLUCAP 112* 106* 106*   Protein Profile:  Recent Labs Lab 02/04/15 1345 02/05/15 0504 02/06/15 0434  ALBUMIN 2.4* 2.3* 2.2*    Height:   Ht Readings from Last 1 Encounters:  01/16/2015 5\' 2"  (1.575 m)    Weight:   Wt Readings from Last 1 Encounters:  02/06/15 180 lb 12.4 oz (82 kg)    Filed Weights   02/04/15 0703 02/05/15 0500 02/06/15 0500  Weight: 175 lb 4.3 oz (79.5 kg) 175 lb 0.7 oz (79.4 kg) 180 lb 12.4 oz (82 kg)     Wt Readings from Last 10 Encounters:  02/06/15 180 lb 12.4 oz (82 kg)     BMI:  Body mass index is 33.06 kg/(m^2).  Estimated Nutritional Needs:   Kcal:  [phone] kcals (11-14 kcals/kg) ABW of 79.4 kg  Protein:  100-125 g (2.0-2.5 g/kg) using IBW 50 kg  Fluid:  1500-1750 mL (30-35 ml/kg)   HIGH Care Level  Romelle Starcher MS, RD, LDN [phone] Pager

## 2015-02-06 NOTE — Progress Notes (Signed)
   02/06/15 1554  Clinical Encounter Type  Visited With Patient  Visit Type Follow-up;Spiritual support  Consult/Referral To Chaplain  Spiritual Encounters  Spiritual Needs Sacred text;Prayer  Chaplain rounded in unit and went in to visit with patient. Offered scripture and prayer. Chaplain Karlye Ihrig A. Lorimer Tiberio Ext. [phone]

## 2015-02-06 NOTE — Progress Notes (Signed)
Notified Dr. Betti Cruz about pt's calcium level of 6.1.  Per Dr. Betti Cruz, put in a pharmacy consult for electrolytes.

## 2015-02-06 NOTE — Assessment & Plan Note (Signed)
--  Currently on epinephrine, vasopressi and levophed drips. --Given recent MI, will stop vasopressin.

## 2015-02-06 NOTE — Progress Notes (Signed)
Made Dr. Luberta Mutter aware of the positive occult blood and negative cdiff.- no new orders.

## 2015-02-06 NOTE — Assessment & Plan Note (Signed)
--  Currently vent dependent, intubated during code.  --Will wean vent as mental status improves.

## 2015-02-06 NOTE — Progress Notes (Signed)
During my second assessment, the pupils were found to be different sizes, the right one a 3 and reactive and the left one a 4 and reactive. The charge nurse was called to assess as well.  Dr. Sheryle Hail also assessed the pt. And agreed that the pupils were different sizes, but both reactive.  Per Dr. Sheryle Hail, no further treatment was needed at this time.  Will continue to monitor.

## 2015-02-06 NOTE — Assessment & Plan Note (Signed)
--  Presenting problem, s/p cath and stent of RCA, cardiology following.  

## 2015-02-06 NOTE — Progress Notes (Signed)
   02/06/15 1330  Clinical Encounter Type  Visited With Family  Visit Type Follow-up  Consult/Referral To Chaplain  Spiritual Encounters  Spiritual Needs Emotional  Chaplain offered a compassionate presence and offering of spiritual support to family. Chaplain Creston Klas A. Juno Alers Ext. [phone]

## 2015-02-06 NOTE — Assessment & Plan Note (Signed)
--  Uncertain etiology, s/p STEMI and cath.  

## 2015-02-06 NOTE — Consult Note (Signed)
[facility] CONSULT NOTE    Date: 02/06/2015                  Patient Name:  Sophia Bradley  MRN: [id number]  DOB: 06-28-1926  Age / Sex: 81 y.o., female         PCP: Jerl Mina, MD                 Service Requesting Consult: Dr. Luberta Mutter                 Reason for Consult: Acute renal failure            History of Present Illness: Patient is a 81 y.o. female with a PMHx of hypertension, asthma, hyperlipidemia, GERD, coronary artery disease, who was admitted to [facility] on 02/15/2015 for evaluation of chest pain, ultimately patient was found to have an acute myocardial infarction.  She now has respiratory failure as well.  We are asked to see her for acute renal failure. Her creatinine is currently up to 3.94.  She has been oliguric over the past 2 days. Urine output over the past 24 hours was 125 cc.  She is on multiple pressors at the moment. She also had metabolic acidosis which improved.     Medications: Outpatient medications: Prescriptions prior to admission  Medication Sig Dispense Refill Last Dose  . aspirin EC 81 MG tablet Take 81 mg by mouth daily.   unknown  . cloNIDine (CATAPRES) 0.2 MG tablet Take 0.6 mg by mouth 3 (three) times daily.   unknown  . diltiazem (CARDIZEM CD) 180 MG 24 hr capsule Take 180 mg by mouth daily.   unknown  . furosemide (LASIX) 20 MG tablet Take 20 mg by mouth daily.   unknown  . nebivolol (BYSTOLIC) 10 MG tablet Take 10 mg by mouth daily.   unknown  . omeprazole (PRILOSEC) 20 MG capsule Take 20 mg by mouth daily.   unknown  . pravastatin (PRAVACHOL) 40 MG tablet Take 40 mg by mouth daily.   unknown  . Vitamin D, Ergocalciferol, (DRISDOL) 50000 UNITS CAPS capsule Take 50,000 Units by mouth every 7 (seven) days.   unknown    Current medications: Current Facility-Administered Medications  Medication Dose Route Frequency Provider Last Rate Last Dose  . acetaminophen (TYLENOL) tablet 650 mg  650 mg Oral Q4H PRN Marcina Millard, MD   650 mg at 02/05/15 1813  . antiseptic oral rinse (CPC / CETYLPYRIDINIUM CHLORIDE 0.05%) solution 7 mL  7 mL Mouth Rinse QID Shane Crutch, MD   7 mL at 02/06/15 0438  . aspirin EC tablet 81 mg  81 mg Oral Daily Katha Hamming, MD   81 mg at 02/05/15 1057  . chlorhexidine (PERIDEX) 0.12 % solution 15 mL  15 mL Mouth Rinse BID Shane Crutch, MD   15 mL at 02/06/15 0908  . EPINEPHrine (ADRENALIN) 4 mg in dextrose 5 % 250 mL (0.016 mg/mL) infusion  0.5-20 mcg/min Intravenous Titrated Shane Crutch, MD 7.5 mL/hr at 02/06/15 0900 2 mcg/min at 02/06/15 0900  . feeding supplement (VITAL HIGH PROTEIN) liquid 1,000 mL  1,000 mL Per Tube Q24H Shane Crutch, MD   1,000 mL at 02/05/15 1259  . fentaNYL (SUBLIMAZE) injection 100 mcg  100 mcg Intravenous Q15 min PRN Shane Crutch, MD      . fentaNYL (SUBLIMAZE) injection 100 mcg  100 mcg Intravenous Q2H PRN Shane Crutch, MD      . free water 25 mL  25 mL Per Tube Continuous Shane Crutch, MD   25 mL at 02/05/15 1348  . heparin injection 5,000 Units  5,000 Units Subcutaneous 3 times per day Shane Crutch, MD   5,000 Units at 02/06/15 0657  . insulin aspart (novoLOG) injection 0-9 Units  0-9 Units Subcutaneous 6 times per day Shane Crutch, MD   2 Units at 02/05/15 1307  . nitroGLYCERIN (NITROSTAT) SL tablet 0.4 mg  0.4 mg Sublingual Q5 min PRN Katha Hamming, MD      . norepinephrine (LEVOPHED) 16 mg in dextrose 5 % 250 mL (0.064 mg/mL) infusion  25 mcg/min Intravenous Titrated Shane Crutch, MD 23.4 mL/hr at 02/06/15 0900 25 mcg/min at 02/06/15 0900  . ondansetron (ZOFRAN) injection 4 mg  4 mg Intravenous Q6H PRN Marcina Millard, MD   4 mg at 02/04/15 7564  . pantoprazole (PROTONIX) injection 40 mg  40 mg Intravenous Q24H Shane Crutch, MD   40 mg at 02/05/15 1711  . piperacillin-tazobactam (ZOSYN) IVPB 3.375 g  3.375 g Intravenous Q12H Shaune Pollack, MD   3.375 g  at 02/06/15 0452  . pravastatin (PRAVACHOL) tablet 40 mg  40 mg Oral Daily Katha Hamming, MD   40 mg at 02/05/15 1349  . propofol (DIPRIVAN) 1000 MG/100ML infusion  0-50 mcg/kg/min Intravenous Continuous Shane Crutch, MD   Stopped at 02/06/15 0700  . senna-docusate (Senokot-S) tablet 1 tablet  1 tablet Per Tube BID Shane Crutch, MD   1 tablet at 02/05/15 2147  . sodium bicarbonate 150 mEq in dextrose 5 % 1,000 mL infusion   Intravenous Continuous Shane Crutch, MD 50 mL/hr at 02/05/15 1711    . ticagrelor (BRILINTA) tablet 90 mg  90 mg Oral BID Marcina Millard, MD   90 mg at 02/05/15 2147  . vasopressin (PITRESSIN) 40 Units in sodium chloride 0.9 % 250 mL (0.16 Units/mL) infusion  0.03 Units/min Intravenous Continuous Shaune Pollack, MD   Stopped at 02/06/15 0800      Allergies: Allergies  Allergen Reactions  . Norvasc [Amlodipine Besylate] Swelling  . Vesicare [Solifenacin] Swelling  . Advair Diskus [Fluticasone-Salmeterol] Rash  . Sulfa Antibiotics Rash      Past Medical History: Past Medical History  Diagnosis Date  . Hypertension   . Asthma   . Coronary artery disease      Past Surgical History: Past Surgical History  Procedure Laterality Date  . Pacemaker insertion    . Kidney stent    . Coronary angioplasty with stent placement    . Cardiac catheterization N/A 01/31/2015    Procedure: Left Heart Cath;  Surgeon: Lamar Blinks, MD;  Location: [facility] INVASIVE CV LAB;  Service: Cardiovascular;  Laterality: N/A;  . Cardiac catheterization N/A 01/30/2015    Procedure: Coronary Stent Intervention;  Surgeon: Marcina Millard, MD;  Location: [facility] INVASIVE CV LAB;  Service: Cardiovascular;  Laterality: N/A;     Family History: No family history on file.   Social History: History   Social History  . Marital Status: Widowed    Spouse Name: N/A  . Number of Children: N/A  . Years of Education: N/A   Occupational History  . Not on file.    Social History Main Topics  . Smoking status: Former Games developer  . Smokeless tobacco: Not on file  . Alcohol Use: Yes  . Drug Use: Not on file  . Sexual Activity: Not on file   Other Topics Concern  . Not on file   Social History Narrative     Review  of Systems: Pt unable to provide as shes intubated/sedated.  Vital Signs: Blood pressure 98/56, pulse 99, temperature 99 F (37.2 C), temperature source Rectal, resp. rate 24, height  (1.575 m), weight 82 kg (180 lb 12.4 oz), SpO2 98 %.  Weight trends: Filed Weights   02/04/15 0703 02/05/15 0500 02/06/15 0500  Weight: 79.5 kg (175 lb 4.3 oz) 79.4 kg (175 lb 0.7 oz) 82 kg (180 lb 12.4 oz)    Physical Exam: General: Critically ill appearing  Head: Normocephalic, atraumatic.  Eyes: Anicteric, has some spontaneous extraoccular movements  Nose: Mucous membranes moist, not inflammed, nonerythematous.  Oral Cavity: ETT in place  Neck: supple.  Lungs:  Bilateral rhonchi, on ventilator.  Heart: S1S2 periods of irregularity noted.  Abdomen:  Soft NTND BS present.  Extremities: No pretibial edema.  Neurologic: Intubated, not following commands  Skin: Bilateral UE ecchymoses    Lab results: Basic Metabolic Panel:  Recent Labs Lab 02/04/15 0400  02/04/15 2028 02/05/15 0504 02/06/15 0434  NA 138  < > 136 135 130*  K 5.0  < > 4.0 3.8 4.5  CL 105  < > 103 100* 93*  CO2 9*  < > 16* 20* 23  GLUCOSE 60*  < > 250* 233* 132*  BUN 25*  < > 32* 38* 50*  CREATININE 1.93*  < > 2.65* 3.08* 3.94*  CALCIUM 7.7*  < > 6.7* 6.6* 6.1*  MG 1.7  --   --   --   --   < > = values in this interval not displayed.  Liver Function Tests:  Recent Labs Lab 02/04/15 1345 02/05/15 0504 02/06/15 0434  AST >2275* >2275* >2275*  ALT 1343* 2210* 2139*  ALKPHOS 112 87 94  BILITOT 1.0 1.0 1.7*  PROT 5.0* 4.7* 4.5*  ALBUMIN 2.4* 2.3* 2.2*   No results for input(s): LIPASE, AMYLASE in the last 168 hours. No results for input(s): AMMONIA in  the last 168 hours.  CBC:  Recent Labs Lab 01/18/2015 1157 02/04/15 0400 02/04/15 1345 02/05/15 0504 02/06/15 0434  WBC 6.7 22.9* 37.7* 16.1* 13.9*  NEUTROABS 5.0  --   --  15.0* 12.9*  HGB 11.2* 10.6* 9.4* 9.8* 9.4*  HCT 35.3 35.6 32.7* 31.6* 28.5*  MCV 89.9 95.1 98.5 90.1 87.6  PLT 251 216 184 158 135*    Cardiac Enzymes:  Recent Labs Lab 02/08/2015 1157 02/02/2015 1331 01/25/2015 2029 02/04/15 0359 02/04/15 1345  CKTOTAL 80  --   --   --   --   CKMB  --  5.8*  --   --   --   TROPONINI 0.03 0.18* >65.00* >65.00* >65.00*    BNP: Invalid input(s): POCBNP  CBG:  Recent Labs Lab 02/05/15 1604 02/05/15 1952 02/05/15 2353 02/06/15 0347 02/06/15 0755  GLUCAP 105* 100* 112* 112* 106*    Microbiology: Results for orders placed or performed during the hospital encounter of 02/08/2015  MRSA PCR Screening     Status: None   Collection Time: 01/20/2015 11:43 AM  Result Value Ref Range Status   MRSA by PCR NEGATIVE NEGATIVE Final    Comment:        The GeneXpert MRSA Assay (FDA approved for NASAL specimens only), is one component of a comprehensive MRSA colonization surveillance program. It is not intended to diagnose MRSA infection nor to guide or monitor treatment for MRSA infections.     Coagulation Studies: No results for input(s): LABPROT, INR in the last 72 hours.  Urinalysis:  Recent Labs  02/10/2015 1211  COLORURINE STRAW*  LABSPEC 1.008  PHURINE 7.0  GLUCOSEU NEGATIVE  HGBUR 1+*  BILIRUBINUR NEGATIVE  KETONESUR NEGATIVE  PROTEINUR NEGATIVE  NITRITE NEGATIVE  LEUKOCYTESUR NEGATIVE      Imaging: Dg Chest 1 View  02/06/2015   CLINICAL DATA:  Shortness of breath.  Intubation.  EXAM: CHEST  1 VIEW  COMPARISON:  02/04/2015.  CT 09/19/2014 .  FINDINGS: Surgical clips are in the right neck. Endotracheal tube, NG tube, left IJ line in stable position. Cardiac pacer with lead tips in right atrium right ventricle. Heart size and pulmonary vascularity  normal. Progressive bibasilar atelectasis and/or infiltrates. Small right pleural effusion. No pneumothorax.  IMPRESSION: 1. Lines and tubes in stable position. 2. Low lung volumes with progressive bibasilar atelectasis and/or infiltrates. Small right pleural effusion.   Electronically Signed   By: Maisie Fus  Register   On: 02/06/2015 07:38   Dg Chest 1 View  02/04/2015   CLINICAL DATA:  PICC line placement.  EXAM: CHEST  1 VIEW  COMPARISON:  Chest x-ray dated 01/24/2015.  FINDINGS: I do not see a right arm or left arm PICC line. There is a partially visualized line overlying the left lung apex which may have been inserted via the left internal jugular vein. If so, the line terminates in the expected region of the lower left internal jugular vein or left brachiocephalic vein.  Cardiomediastinal silhouette is grossly stable in size and configuration. There is now diffuse bilateral interstitial edema. There is a probable small right pleural effusion blunting the right costophrenic angle. No pneumothorax.  IMPRESSION: No right arm or left arm PICC line identified. A partially visualized line overlying the left lung apex may have been inserted via the left internal jugular vein. If this is the line in question, its tip terminates in the expected region of the lower left internal jugular vein or left brachiocephalic vein and advancement would be recommended.  No pneumothorax seen.  New diffuse bilateral interstitial edema suggesting volume overload/ congestive heart failure.  Probable trace right pleural effusion.  Stable mild cardiomegaly.   Electronically Signed   By: Bary Richard M.D.   On: 02/04/2015 13:21   Dg Abd 1 View  02/04/2015   CLINICAL DATA:  OG tube placement.  EXAM: ABDOMEN - 1 VIEW  COMPARISON:  09/20/2014  FINDINGS: The OG tube tip is in the distal stomach.  Normal bowel gas pattern.  No osseous abnormality.  IMPRESSION: OG tube tip in the distal stomach.   Electronically Signed   By: Francene Boyers  M.D.   On: 02/04/2015 15:34      Assessment & Plan:  81 y.o. female with a PMHx of hypertension, asthma, hyperlipidemia, GERD, coronary artery disease, who was admitted to [facility] on 02/05/2015 for evaluation of chest pain, ultimately patient was found to have an acute myocardial infarction.  She now has respiratory failure as well.   1.  Acute renal failure due to ATN.  Pt with hypotension in the setting of acute myocardial infarction and respiratory failure. I have discussed the case with pt's daughter and Dr. Dereck Leep.  We have offered pt CRRT, pt's daugther would like to consider this a bit further.  In the mean time would continue supportive care, continue pressors to maintain a map of 65 or greater.    2.  Acute respiratory failure:  Continues on mechanical ventilation at the moment, doesn't appear weanable at present, continue vent support per pulm/cc.   3.  Metabolic acidosis:  Continue sodium bicarbonate drip for now as serum bicarbonate has improved.   4.  Anemia unspecified:  hgb down to 9.4, likely multifactorial, dilution playing some role, would continue to monitor CBC and transfuse as necessary.

## 2015-02-06 NOTE — Progress Notes (Signed)
MEDICATION RELATED CONSULT NOTE - Follow up   Pharmacy Consult for Constipation prevention while intubated in ICU and electrolytes Indication: ICU intubated  Allergies  Allergen Reactions  . Norvasc [Amlodipine Besylate] Swelling  . Vesicare [Solifenacin] Swelling  . Advair Diskus [Fluticasone-Salmeterol] Rash  . Sulfa Antibiotics Rash    Patient Measurements: Height:  (157.5 cm) Weight: 180 lb 12.4 oz (82 kg) IBW/kg (Calculated) : 50.1 Adjusted Body Weight:   Vital Signs: Temp: 98.8 F (37.1 C) (07/22 0900) Temp Source: Rectal (07/22 0700) BP: 117/57 mmHg (07/22 0900) Pulse Rate: 99 (07/22 0600) Intake/Output from previous day: 07/21 0701 - 07/22 0700 In: 3074.6 [I.V.:2061; NG/GT:803.7; IV Piggyback:210] Out: 125 [Urine:125] Intake/Output from this shift: Total I/O In: 525.1 [I.V.:318.7; NG/GT:106.3; IV Piggyback:100] Out: 0   Labs:  Recent Labs  02/04/15 0400 02/04/15 1345 02/04/15 2028 02/05/15 0504 02/06/15 0434  WBC 22.9* 37.7*  --  16.1* 13.9*  HGB 10.6* 9.4*  --  9.8* 9.4*  HCT 35.6 32.7*  --  31.6* 28.5*  PLT 216 184  --  158 135*  CREATININE 1.93* 2.49* 2.65* 3.08* 3.94*  MG 1.7  --   --   --   --   ALBUMIN  --  2.4*  --  2.3* 2.2*  PROT  --  5.0*  --  4.7* 4.5*  AST  --  >2275*  --  >2275* >2275*  ALT  --  1343*  --  2210* 2139*  ALKPHOS  --  112  --  87 94  BILITOT  --  1.0  --  1.0 1.7*   Estimated Creatinine Clearance: 10 mL/min (by C-G formula based on Cr of 3.94).   Microbiology: Recent Results (from the past 720 hour(s))  MRSA PCR Screening     Status: None   Collection Time: 02/10/2015 11:43 AM  Result Value Ref Range Status   MRSA by PCR NEGATIVE NEGATIVE Final    Comment:        The GeneXpert MRSA Assay (FDA approved for NASAL specimens only), is one component of a comprehensive MRSA colonization surveillance program. It is not intended to diagnose MRSA infection nor to guide or monitor treatment for MRSA infections.    C difficile quick scan w PCR reflex ([facility] only)     Status: None   Collection Time: 02/06/15  9:08 AM  Result Value Ref Range Status   C Diff antigen NEGATIVE NEGATIVE Final   C Diff toxin NEGATIVE NEGATIVE Final   C Diff interpretation Negative for C. difficile  Final    Medical History: Past Medical History  Diagnosis Date  . Hypertension   . Asthma   . Coronary artery disease     Medications:  Scheduled:  . antiseptic oral rinse  7 mL Mouth Rinse QID  . aspirin EC  81 mg Oral Daily  . chlorhexidine  15 mL Mouth Rinse BID  . feeding supplement (VITAL HIGH PROTEIN)  1,000 mL Per Tube Q24H  . heparin subcutaneous  5,000 Units Subcutaneous 3 times per day  . insulin aspart  0-9 Units Subcutaneous 6 times per day  . pantoprazole (PROTONIX) IV  40 mg Intravenous Q24H  . piperacillin-tazobactam (ZOSYN)  IV  3.375 g Intravenous Q12H  . pravastatin  40 mg Oral Daily  . senna-docusate  1 tablet Per Tube BID  . ticagrelor  90 mg Oral BID    Assessment: Nurse reported multiple loose stools this morning. Will hold stool softeners and laxatives for now. Adjusted calcium  7.5. Received 1 gm bolus this morning  Goal of Therapy:  Prevention of constipation and replete calcium.  Plan:  Will hold stool softeners and laxatives for now and reorder calcium level for tomorrow morning.   Luz Brazen Marry Kusch 02/06/2015,12:11 PM

## 2015-02-06 NOTE — Progress Notes (Signed)
ANTIBIOTIC CONSULT NOTE - FOLLOW UP   Pharmacy Consult for Vancomycin and Zosyn  Indication: rule out sepsis  Allergies  Allergen Reactions  . Norvasc [Amlodipine Besylate] Swelling  . Vesicare [Solifenacin] Swelling  . Advair Diskus [Fluticasone-Salmeterol] Rash  . Sulfa Antibiotics Rash    Patient Measurements: Height:  (157.5 cm) Weight: 180 lb 12.4 oz (82 kg) IBW/kg (Calculated) : 50.1   Vital Signs: Temp: 99 F (37.2 C) (07/22 0700) Temp Source: Rectal (07/22 0700) BP: 98/56 mmHg (07/22 0700) Pulse Rate: 99 (07/22 0600) Intake/Output from previous day: 07/21 0701 - 07/22 0700 In: 3049.6 [I.V.:2061; NG/GT:778.7; IV Piggyback:210] Out: 125 [Urine:125] Intake/Output from this shift: Total I/O In: 161.8 [I.V.:61.8; IV Piggyback:100] Out: -   Labs:  Recent Labs  02/04/15 1345 02/04/15 2028 02/05/15 0504 02/06/15 0434  WBC 37.7*  --  16.1* 13.9*  HGB 9.4*  --  9.8* 9.4*  PLT 184  --  158 135*  CREATININE 2.49* 2.65* 3.08* 3.94*   Estimated Creatinine Clearance: 10 mL/min (by C-G formula based on Cr of 3.94).  Recent Labs  02/04/15 2028  [facility] 15     Microbiology: Recent Results (from the past 720 hour(s))  MRSA PCR Screening     Status: None   Collection Time: 01/22/2015 11:43 AM  Result Value Ref Range Status   MRSA by PCR NEGATIVE NEGATIVE Final    Comment:        The GeneXpert MRSA Assay (FDA approved for NASAL specimens only), is one component of a comprehensive MRSA colonization surveillance program. It is not intended to diagnose MRSA infection nor to guide or monitor treatment for MRSA infections.     Medical History: Past Medical History  Diagnosis Date  . Hypertension   . Asthma   . Coronary artery disease     Medications:  Scheduled:  . antiseptic oral rinse  7 mL Mouth Rinse QID  . aspirin EC  81 mg Oral Daily  . chlorhexidine  15 mL Mouth Rinse BID  . feeding supplement (VITAL HIGH PROTEIN)  1,000 mL Per Tube  Q24H  . heparin subcutaneous  5,000 Units Subcutaneous 3 times per day  . insulin aspart  0-9 Units Subcutaneous 6 times per day  . pantoprazole (PROTONIX) IV  40 mg Intravenous Q24H  . piperacillin-tazobactam (ZOSYN)  IV  3.375 g Intravenous Q12H  . pravastatin  40 mg Oral Daily  . senna-docusate  1 tablet Per Tube BID  . ticagrelor  90 mg Oral BID   Infusions:  . epinephrine 2 mcg/min (02/06/15 0900)  . free water    . norepinephrine (LEVOPHED) Adult infusion 25 mcg/min (02/06/15 0900)  . propofol (DIPRIVAN) infusion Stopped (02/06/15 0700)  .  sodium bicarbonate  infusion 1000 mL 50 mL/hr at 02/05/15 1711  . vasopressin (PITRESSIN) infusion - *FOR SHOCK* Stopped (02/06/15 0800)   PRN: acetaminophen, fentaNYL (SUBLIMAZE) injection, fentaNYL (SUBLIMAZE) injection, nitroGLYCERIN, ondansetron (ZOFRAN) IV  Assessment: 81 y/o F admitted with SOB, STEMI, elevated lactic acid level ordered empiric abx for r/o sepsis. Patient with elevated SCR, AKI so level was ordered subsequent to the first dose. The level is virtually meaningless except to r/o toxicity as this would be nowhere near steady state.   7/22: Patient's renal function continues to diminish. Currently serum creatinine is 3.94 mg/dl;    Goal of Therapy:  Vancomycin trough level 15-20 mcg/ml  Plan:  Random level ordered to be drawn ~48 hours post dose to assess for toxicity. Patient likely will need  to be dosed per levels based on renal function decline. Vancomycin 750 mg IV q48 hours dose has been discontinued. Will need to follow up and restart based on results of random level. Scr ordered with am labs.   Zosyn: Will continue Zosyn 3.375 g IV q12 hours.    Clay Solum D 02/06/2015,9:17 AM

## 2015-02-06 NOTE — Progress Notes (Signed)
Pt now made DNR- plan for terminal wean later today once all family is here.

## 2015-02-06 NOTE — Assessment & Plan Note (Addendum)
--  Secondary to cardiac arrest, possibly anoxic.  --Will decrease sedation today and assess.

## 2015-02-06 NOTE — Assessment & Plan Note (Signed)
--  secondary to cardiac arrest, will continue to monitor.

## 2015-02-06 NOTE — Progress Notes (Signed)
[facility] Encounter Note  Patient: Sophia Bradley / Admit Date: Feb 12, 2015 / Date of Encounter: 02/06/2015, 8:12 AM   Subjective: Patient has had low blood pressure unable to stay hemodynamically stable but has had reasonable control with levo fed  Review of Systems: Unable to perform Objective: Telemetry: Sinus tachycardia Physical Exam: Blood pressure 98/56, pulse 99, temperature 99 F (37.2 C), temperature source Rectal, resp. rate 24, height  (1.575 m), weight 180 lb 12.4 oz (82 kg), SpO2 98 %. Body mass index is 33.06 kg/(m^2). General: Well developed, well nourished, in no acute distress. Head: Normocephalic, atraumatic, sclera non-icteric, no xanthomas, nares are without discharge. Neck: No apparent masses Lungs: Respirator working well no apparent wheezes but some basilar crackles  Heart: Regular rate and rhythm, normal S1 S2, 2+ mitral murmur, no rub, no gallop, PMI is normal size and placement, carotid upstroke normal without bruit, jugular venous pressure normal Abdomen: Soft,  non-distended with normoactive bowel sounds. No hepatosplenomegaly. Abdominal aorta is normal size without bruit Extremities: Trace edema, no clubbing, no cyanosis, no ulcers,  Peripheral: 2+ radial, 2+ femoral, 2+ dorsal pedal pulses Neuro: Sedated   Intake/Output Summary (Last 24 hours) at 02/06/15 0812 Last data filed at 02/06/15 0640  Gross per 24 hour  Intake 2961.13 ml  Output    125 ml  Net 2836.13 ml    Inpatient Medications:  . antiseptic oral rinse  7 mL Mouth Rinse QID  . aspirin EC  81 mg Oral Daily  . chlorhexidine  15 mL Mouth Rinse BID  . feeding supplement (VITAL HIGH PROTEIN)  1,000 mL Per Tube Q24H  . heparin subcutaneous  5,000 Units Subcutaneous 3 times per day  . insulin aspart  0-9 Units Subcutaneous 6 times per day  . pantoprazole (PROTONIX) IV  40 mg Intravenous Q24H  . piperacillin-tazobactam (ZOSYN)  IV  3.375 g Intravenous Q12H  .  pravastatin  40 mg Oral Daily  . senna-docusate  1 tablet Per Tube BID  . ticagrelor  90 mg Oral BID   Infusions:  . epinephrine 1 mcg/min (02/05/15 4098)  . free water    . norepinephrine (LEVOPHED) Adult infusion 25 mcg/min (02/06/15 0640)  . propofol (DIPRIVAN) infusion 10 mcg/kg/min (02/06/15 0442)  .  sodium bicarbonate  infusion 1000 mL 50 mL/hr at 02/05/15 1711  . vasopressin (PITRESSIN) infusion - *FOR SHOCK* 0.03 Units/min (02/05/15 2056)    Labs:  Recent Labs  02/04/15 0400  02/05/15 0504 02/06/15 0434  NA 138  < > 135 130*  K 5.0  < > 3.8 4.5  CL 105  < > 100* 93*  CO2 9*  < > 20* 23  GLUCOSE 60*  < > 233* 132*  BUN 25*  < > 38* 50*  CREATININE 1.93*  < > 3.08* 3.94*  CALCIUM 7.7*  < > 6.6* 6.1*  MG 1.7  --   --   --   < > = values in this interval not displayed.  Recent Labs  02/05/15 0504 02/06/15 0434  AST >2275* >2275*  ALT 2210* 2139*  ALKPHOS 87 94  BILITOT 1.0 1.7*  PROT 4.7* 4.5*  ALBUMIN 2.3* 2.2*    Recent Labs  02/05/15 0504 02/06/15 0434  WBC 16.1* 13.9*  NEUTROABS 15.0* 12.9*  HGB 9.8* 9.4*  HCT 31.6* 28.5*  MCV 90.1 87.6  PLT 158 135*    Recent Labs  Feb 12, 2015 1157 12-Feb-2015 1331 Feb 12, 2015 2029 02/04/15 0359 02/04/15 1345  CKTOTAL 80  --   --   --   --  CKMB  --  5.8*  --   --   --   TROPONINI 0.03 0.18* >65.00* >65.00* >65.00*   Invalid input(s): POCBNP No results for input(s): HGBA1C in the last 72 hours.   Weights: Filed Weights   02/04/15 0703 02/05/15 0500 02/06/15 0500  Weight: 175 lb 4.3 oz (79.5 kg) 175 lb 0.7 oz (79.4 kg) 180 lb 12.4 oz (82 kg)     Radiology/Studies:  Dg Chest 1 View  02/06/2015   CLINICAL DATA:  Shortness of breath.  Intubation.  EXAM: CHEST  1 VIEW  COMPARISON:  02/04/2015.  CT 09/19/2014 .  FINDINGS: Surgical clips are in the right neck. Endotracheal tube, NG tube, left IJ line in stable position. Cardiac pacer with lead tips in right atrium right ventricle. Heart size and pulmonary  vascularity normal. Progressive bibasilar atelectasis and/or infiltrates. Small right pleural effusion. No pneumothorax.  IMPRESSION: 1. Lines and tubes in stable position. 2. Low lung volumes with progressive bibasilar atelectasis and/or infiltrates. Small right pleural effusion.   Electronically Signed   By: Maisie Fus  Register   On: 02/06/2015 07:38   Dg Chest 1 View  02/04/2015   CLINICAL DATA:  PICC line placement.  EXAM: CHEST  1 VIEW  COMPARISON:  Chest x-ray dated 02/04/2015.  FINDINGS: I do not see a right arm or left arm PICC line. There is a partially visualized line overlying the left lung apex which may have been inserted via the left internal jugular vein. If so, the line terminates in the expected region of the lower left internal jugular vein or left brachiocephalic vein.  Cardiomediastinal silhouette is grossly stable in size and configuration. There is now diffuse bilateral interstitial edema. There is a probable small right pleural effusion blunting the right costophrenic angle. No pneumothorax.  IMPRESSION: No right arm or left arm PICC line identified. A partially visualized line overlying the left lung apex may have been inserted via the left internal jugular vein. If this is the line in question, its tip terminates in the expected region of the lower left internal jugular vein or left brachiocephalic vein and advancement would be recommended.  No pneumothorax seen.  New diffuse bilateral interstitial edema suggesting volume overload/ congestive heart failure.  Probable trace right pleural effusion.  Stable mild cardiomegaly.   Electronically Signed   By: Bary Richard M.D.   On: 02/04/2015 13:21   Dg Chest 1 View  02/08/2015   CLINICAL DATA:  Shortness of breath, near syncope  EXAM: CHEST  1 VIEW  COMPARISON:  Portable exam 1232 hours compared to 10/10/2012  FINDINGS: LEFT subclavian transvenous pacemaker with leads projecting over RIGHT atrium and RIGHT ventricle, unchanged.  Borderline  enlargement of cardiac silhouette.  Atherosclerotic calcification aorta.  Mediastinal contours and pulmonary vascularity normal.  Lungs clear.  No pleural effusion or pneumothorax.  Bones demineralized with probable BILATERAL chronic rotator cuff tears.  Surgical clips in the RIGHT cervical region.  IMPRESSION: No acute abnormalities.   Electronically Signed   By: Ulyses Southward M.D.   On: 02/14/2015 12:43   Dg Abd 1 View  02/04/2015   CLINICAL DATA:  OG tube placement.  EXAM: ABDOMEN - 1 VIEW  COMPARISON:  09/20/2014  FINDINGS: The OG tube tip is in the distal stomach.  Normal bowel gas pattern.  No osseous abnormality.  IMPRESSION: OG tube tip in the distal stomach.   Electronically Signed   By: Francene Boyers M.D.   On: 02/04/2015 15:34  Assessment and Recommendation  81 y.o. female with the paroxysmal nonvalvular atrial fibrillation and coronary artery disease with acute inferior non-ST elevation myocardial infarction Catheterization shows mild inferior hypokinesis with ejection fraction of 50% and ostial stenosis of right coronary artery status post successful PCI and stent placement of right coronary artery Echocardiogram showing normal LV systolic function with no apparent valvular heart disease although clinically the patient has had a significant myocardial infarction and regurgitation likely causing this specific acute pulmonary edema and congestive heart failure with pop possible papillary muscle dysfunction. We have discussed at length with the family the possibility of sudden death as well as ventricular rupture 1. Continue Plavix and aspirin for further risk reduction of in-stent thrombosis of PCI and stent placement of ostial right coronary artery 2. Continue intubation and oxygenation for 24 more hours at least until further evaluation by echocardiogram of LV systolic dysfunction and papillary muscle dysfunction 3. Continue pulmonary care with critical care team and appreciated 4.  Further diagnostic testing and treatment options after above 5. Weekend rounding by Dr.paraschos  Signed, Arnoldo Hooker M.D. FACC

## 2015-02-06 NOTE — Progress Notes (Signed)
CRITICAL CARE MEDICINE: ICU PROGRESS NOTE   ASSESSMENT / PLAN:  I discussed the case with pt's daughter, RN, and nephrology (Dr. Rexene Edison). Family feels that the patient would not want to be maintained on the ventilator. They have decided to try to keep her comfortable and not keep her alive, and we feel that this is a reasonable decision.  The patient will be terminally weaned when other family arrives later tonight.   Acute respiratory failure --Currently vent dependent, intubated during code.  --Will wean vent as mental status improves.   Cardiac arrest --Uncertain etiology, s/p STEMI and cath.   Myocardial infarction acute --Presenting problem, s/p cath and stent of RCA, cardiology following.   Acute renal failure --Likely due to ATN from hypotensive episode in code.  --Acidosis improved, bicarb drip decreased to 50cc/hr, conservative fluids.   Acute encephalopathy --Due to cardiac arrest, mental status appears to be improving. Continue pain and sedation meds as needed, wean as tolerated.   Hypotension --Currently on epinephrine and levophed drips. Will wean epi drip today to MAP > 60.   Shock liver --secondary to cardiac arrest, will continue to monitor.  Acute encephalopathy --Secondary to cardiac arrest, possibly anoxic.  --Will decrease sedation today and assess.   Acute renal failure --Acidosis improved, stop bicarb drip, and change to normal saline.  --Discussed with Dr. Cherylann Ratel, will initial CRRT.   Acute respiratory failure --Currently vent dependent, intubated during code.  --Will wean vent as mental status improves.   Cardiac arrest --Uncertain etiology, s/p STEMI and cath.   Cardiogenic shock --Currently on epinephrine, vasopressi and levophed drips. --Given recent MI, will stop vasopressin.   Myocardial infarction acute --Presenting problem, s/p cath and stent of RCA, cardiology following.   Shock liver --secondary to cardiac arrest, will continue  to monitor.  Severe sepsis with septic shock --Possible sepsis complicating cardiac arrest.  --Had fevers overnight, will add vanco and pan-culture.        Name: RASHANA LIEBRECHT MRN: [id number] DOB: [birth year]    ADMISSION DATE:  02/05/2015    CHIEF COMPLAINT:  unresponsive   STUDIES:  CXR report and images reviewed, increasing RLL effusion, Echo WNL.    SUBJECTIVE:   Pt currently on the ventilator, can not provide history or review of systems.    VITAL SIGNS: Temp:  [97.2 F (36.2 C)-100.9 F (38.3 C)] 98.8 F (37.1 C) (07/22 0900) Pulse Rate:  [55-113] 99 (07/22 0600) Resp:  [23-27] 25 (07/22 0900) BP: (88-132)/(45-93) 117/57 mmHg (07/22 0900) SpO2:  [96 %-99 %] 98 % (07/22 0600) FiO2 (%):  [40 %] 40 % (07/22 0815) Weight:  [82 kg (180 lb 12.4 oz)] 82 kg (180 lb 12.4 oz) (07/22 0500) HEMODYNAMICS:   VENTILATOR SETTINGS: Vent Mode:  [-] PRVC FiO2 (%):  [40 %] 40 % Set Rate:  [15 bmp] 15 bmp Vt Set:  [450 mL] 450 mL PEEP:  [5 cmH20] 5 cmH20 INTAKE / OUTPUT:  Intake/Output Summary (Last 24 hours) at 02/06/15 1033 Last data filed at 02/06/15 0900  Gross per 24 hour  Intake 2996.55 ml  Output    125 ml  Net 2871.55 ml    PHYSICAL EXAMINATION: Physical Examination:   VS: BP 117/57 mmHg  Pulse 99  Temp(Src) 98.8 F (37.1 C) (Rectal)  Resp 25  Ht 5\' 2"  (1.575 m)  Wt 82 kg (180 lb 12.4 oz)  BMI 33.06 kg/m2  SpO2 98%  General Appearance: No distress  Neuro:without focal findings. HEENT: PERRLA, EOM intact. Pulmonary:  normal breath sounds   CardiovascularNormal S1,S2.  No m/r/g.   Abdomen: Benign, Soft, non-tender. Renal:  No costovertebral tenderness  GU:  Not performed at this time. Endocrine: No evident thyromegaly. Skin:   warm, no rashes, no ecchymosis  Extremities: normal, no cyanosis, clubbing.   LABS:  CBC  Recent Labs Lab 02/04/15 1345 02/05/15 0504 02/06/15 0434  WBC 37.7* 16.1* 13.9*  HGB 9.4* 9.8* 9.4*  HCT 32.7* 31.6*  28.5*  PLT 184 158 135*   Coag's No results for input(s): APTT, INR in the last 168 hours. BMET  Recent Labs Lab 02/04/15 2028 02/05/15 0504 02/06/15 0434  NA 136 135 130*  K 4.0 3.8 4.5  CL 103 100* 93*  CO2 16* 20* 23  BUN 32* 38* 50*  CREATININE 2.65* 3.08* 3.94*  GLUCOSE 250* 233* 132*   Electrolytes  Recent Labs Lab 02/04/15 0400  02/04/15 2028 02/05/15 0504 02/06/15 0434  CALCIUM 7.7*  < > 6.7* 6.6* 6.1*  MG 1.7  --   --   --   --   < > = values in this interval not displayed. Sepsis Markers  Recent Labs Lab 02/02/2015 2029 02/05/15 0140 02/05/15 0504  LATICACIDVEN 8.0* 7.4* 6.0*   ABG  Recent Labs Lab 02/04/15 1425 02/05/15 0440 02/06/15 0455  PHART 6.86* 7.28* 7.47*  PCO2ART 42 38 28*  PO2ART 91 127* 75*   Liver Enzymes  Recent Labs Lab 02/04/15 1345 02/05/15 0504 02/06/15 0434  AST >2275* >2275* >2275*  ALT 1343* 2210* 2139*  ALKPHOS 112 87 94  BILITOT 1.0 1.0 1.7*  ALBUMIN 2.4* 2.3* 2.2*   Cardiac Enzymes  Recent Labs Lab 02/13/2015 2029 02/04/15 0359 02/04/15 1345  TROPONINI >65.00* >65.00* >65.00*   Glucose  Recent Labs Lab 02/05/15 1128 02/05/15 1604 02/05/15 1952 02/05/15 2353 02/06/15 0347 02/06/15 0755  GLUCAP 157* 105* 100* 112* 112* 106*    Imaging Dg Chest 1 View  02/06/2015   CLINICAL DATA:  Shortness of breath.  Intubation.  EXAM: CHEST  1 VIEW  COMPARISON:  02/04/2015.  CT 09/19/2014 .  FINDINGS: Surgical clips are in the right neck. Endotracheal tube, NG tube, left IJ line in stable position. Cardiac pacer with lead tips in right atrium right ventricle. Heart size and pulmonary vascularity normal. Progressive bibasilar atelectasis and/or infiltrates. Small right pleural effusion. No pneumothorax.  IMPRESSION: 1. Lines and tubes in stable position. 2. Low lung volumes with progressive bibasilar atelectasis and/or infiltrates. Small right pleural effusion.   Electronically Signed   By: Maisie Fus  Register   On:  02/06/2015 07:38     Deep Nicholos Johns, MD [facility] Pager - [phone]  Critical Care Attestation.  I have personally obtained a history, examined the patient, evaluated laboratory and imaging results, formulated the assessment and plan and placed orders. The Patient requires high complexity decision making for assessment and support, frequent evaluation and titration of therapies, application of advanced monitoring technologies and extensive interpretation of multiple databases. The patient has critical illness that could lead imminently to failure of 1 or more organ systems and requires the highest level of physician preparedness to intervene.  Critical Care Time devoted to patient care services described in this note is 45 minutes and is exclusive of time spent in procedures.

## 2015-02-06 NOTE — Progress Notes (Signed)
[facility] -  at [facility]   PATIENT NAME: Maimuna Leaman    MR#:  [id number]  DATE OF BIRTH:  04-29-1926  SUBJECTIVE: Critically ill and requiring pressors, propofol for sedation. Patient now developing renal failure but family  Not consented for  CRRT at. Patient family are thinking  Terminal weaning and comfort care when  other family members arrive.  CHIEF COMPLAINT:   Chief Complaint  Patient presents with  . Shortness of Breath   T REVIEW OF SYSTEMS:  unable to obtain. Due to intubation and sedation.  DRUG ALLERGIES:   Allergies  Allergen Reactions  . Norvasc [Amlodipine Besylate] Swelling  . Vesicare [Solifenacin] Swelling  . Advair Diskus [Fluticasone-Salmeterol] Rash  . Sulfa Antibiotics Rash    VITALS:  Blood pressure 117/57, pulse 99, temperature 98.8 F (37.1 C), temperature source Rectal, resp. rate 25, height  (1.575 m), weight 82 kg (180 lb 12.4 oz), SpO2 98 %.  PHYSICAL EXAMINATION:  GENERAL:  81 y.o.-year-old patient lying in the bed, intubated and on Ventilation. EYES: Pupils equal, round, reactive to light and accommodation. No scleral icterus. Extraocular muscles intact.  HEENT: Head atraumatic, normocephalic. On intubation and ventilation.  NECK:  Supple, no jugular venous distention. No thyroid enlargement, no tenderness.  LUNGS: Normal breath sounds bilaterally, no wheezing, or lateral rales.  CARDIOVASCULAR: S1, S2 normal. No murmurs, rubs, or gallops.  ABDOMEN: Soft, nontender, nondistended. Bowel sounds present. No organomegaly or mass.  EXTREMITIES: No pedal edema, cyanosis, or clubbing.  NEUROLOGIC: Unresponsive on ventilation. PSYCHIATRIC: Unresponsive on ventilation. SKIN: No obvious rash, lesion, or ulcer.    LABORATORY PANEL:   CBC  Recent Labs Lab 02/06/15 0434  WBC 13.9*  HGB 9.4*  HCT 28.5*  PLT 135*    ------------------------------------------------------------------------------------------------------------------  Chemistries   Recent Labs Lab 02/04/15 0400  02/06/15 0434  NA 138  < > 130*  K 5.0  < > 4.5  CL 105  < > 93*  CO2 9*  < > 23  GLUCOSE 60*  < > 132*  BUN 25*  < > 50*  CREATININE 1.93*  < > 3.94*  CALCIUM 7.7*  < > 6.1*  MG 1.7  --   --   AST  --   < > >2275*  ALT  --   < > 2139*  ALKPHOS  --   < > 94  BILITOT  --   < > 1.7*  < > = values in this interval not displayed. ------------------------------------------------------------------------------------------------------------------  Cardiac Enzymes  Recent Labs Lab 02/04/15 1345  TROPONINI >65.00*   ------------------------------------------------------------------------------------------------------------------  RADIOLOGY:  Dg Chest 1 View  02/06/2015   CLINICAL DATA:  Shortness of breath.  Intubation.  EXAM: CHEST  1 VIEW  COMPARISON:  02/04/2015.  CT 09/19/2014 .  FINDINGS: Surgical clips are in the right neck. Endotracheal tube, NG tube, left IJ line in stable position. Cardiac pacer with lead tips in right atrium right ventricle. Heart size and pulmonary vascularity normal. Progressive bibasilar atelectasis and/or infiltrates. Small right pleural effusion. No pneumothorax.  IMPRESSION: 1. Lines and tubes in stable position. 2. Low lung volumes with progressive bibasilar atelectasis and/or infiltrates. Small right pleural effusion.   Electronically Signed   By: Maisie Fus  Register   On: 02/06/2015 07:38   Dg Abd 1 View  02/04/2015   CLINICAL DATA:  OG tube placement.  EXAM: ABDOMEN - 1 VIEW  COMPARISON:  09/20/2014  FINDINGS: The OG tube tip is in the distal stomach.  Normal bowel gas pattern.  No osseous abnormality.  IMPRESSION: OG tube tip in the distal stomach.   Electronically Signed   By: Francene Boyers M.D.   On: 02/04/2015 15:34    EKG:   Orders placed or performed during the hospital encounter  of 03-02-15  . EKG 12-Lead immediately post procedure  . EKG 12-Lead  . EKG 12-Lead immediately post procedure  . EKG 12-Lead  . EKG 12-Lead  . EKG 12-Lead  . EKG 12-Lead  . EKG 12-Lead  . EKG 12-Lead  . EKG 12-Lead  . EKG 12-Lead  . EKG 12-Lead  . EKG 12-Lead  . EKG 12-Lead    ASSESSMENT AND PLAN:   * 1.Acute respiratory failure with hypoxia. Likely due to CHF\and acute MI./And bibasilar pneumonia. Status post intubation. Continue ventilation and nebulizer treatment,  follow-up pulmonary physician. Acute pulmonary pulmonary edema causing the respiratory failure and cardiac arrest DUE to papillary muscle dysfunction in the contest of injury wall MI., Severe mitral regurgitation.   Continue broad-spectrum antibiotics.., Full vent support with the sedation.    * 2.Possible aspiration pneumonia continue Zosyn, follow serial x-rays.   3. Non- ST elevation MI. Troponin>65. S/p PCI, to RCA, continue aspirin, ticagrelor, pravachol.  4.. ARF likely due to ATN, with hypotension;worsening renal function, patient needs CRRT  But  patient's daughter is not quite ready at ,continue bicarbonate drip , due to multiorgan failure family is leaning towards comfort care which is a reasonable decision.  5.Shock liver with elevated LFTs follow closely, monitor daily LFTs. Dr. liver secondary to cardiac arrest  6. Cardiac arrest status post resuscitation: Fifth echocardiogram showed EF of 50% with the inferior wall hypokinesia possible left papillary muscle dysfunction, patient is at risk for ventricular capture and sudden cardiac death. Appreciate Dr. Philemon Kingdom input.  Condition; critical  high risk for cardiac arrest   CODE STATUS;do  NOT RESUSCITATE    All the records are reviewed and case discussed with Care Management/Social Workerr. Management plans discussed with the patient, family and they are in agreement.  CODE STATUS:DNR Critical care time;35 min  Suhaylah Wampole M.D on  02/06/2015 at 1:16 PM  Between 7am to 6pm - Pager - [phone]  After 6pm go to [email or web address] - password [email or web address] [facility]  [facility]  Office  [phone]  CC: Primary care physician; Jerl Mina, MD

## 2015-02-06 NOTE — Progress Notes (Signed)
Spoke to Dr. Gwen Pounds Dr. Cherylann Ratel and Dr. Ardyth Man- Made aware of pt started on vasopressin last night due to low BP- Pt off propofol- not arousing quite yet- withdraws with pain- urine output last night- Dr. Cherylann Ratel made aware.

## 2015-02-06 NOTE — Progress Notes (Signed)
Pt afib/paced on monitor.  Minimal urine output - vitals stable with epi and levophed drip. Plan for terminal wean tonight.  Sedation off since this am- pt will open eyes but not follow commands.

## 2015-02-07 ENCOUNTER — Inpatient Hospital Stay: Payer: Medicare Other

## 2015-02-07 LAB — CBC WITH DIFFERENTIAL/PLATELET
Band Neutrophils: 4 % (ref 0–10)
Basophils Absolute: 0 10*3/uL (ref 0.0–0.1)
Basophils Relative: 0 % (ref 0–1)
Blasts: 0 %
Eosinophils Absolute: 0 10*3/uL (ref 0.0–0.7)
Eosinophils Relative: 0 % (ref 0–5)
HCT: 25.9 % — ABNORMAL LOW (ref 35.0–47.0)
HEMOGLOBIN: 8.5 g/dL — AB (ref 12.0–16.0)
Lymphocytes Relative: 3 % — ABNORMAL LOW (ref 12–46)
Lymphs Abs: 0.6 10*3/uL — ABNORMAL LOW (ref 0.7–4.0)
MCH: 28.6 pg (ref 26.0–34.0)
MCHC: 32.7 g/dL (ref 32.0–36.0)
MCV: 87.2 fL (ref 80.0–100.0)
MYELOCYTES: 0 %
Metamyelocytes Relative: 0 %
Monocytes Absolute: 1.2 10*3/uL — ABNORMAL HIGH (ref 0.1–1.0)
Monocytes Relative: 6 % (ref 3–12)
NRBC: 1 /100{WBCs} — AB
Neutro Abs: 19 10*3/uL — ABNORMAL HIGH (ref 1.7–7.7)
Neutrophils Relative %: 87 % — ABNORMAL HIGH (ref 43–77)
Other: 0 %
PLATELETS: 123 10*3/uL — AB (ref 150–440)
Promyelocytes Absolute: 0 %
RBC: 2.97 MIL/uL — ABNORMAL LOW (ref 3.80–5.20)
RDW: 15.2 % — ABNORMAL HIGH (ref 11.5–14.5)
SMEAR REVIEW: ADEQUATE
WBC: 20.8 10*3/uL — ABNORMAL HIGH (ref 3.6–11.0)

## 2015-02-07 LAB — COMPREHENSIVE METABOLIC PANEL
ALT: 1264 U/L — AB (ref 14–54)
AST: 1314 U/L — ABNORMAL HIGH (ref 15–41)
Albumin: 1.9 g/dL — ABNORMAL LOW (ref 3.5–5.0)
Alkaline Phosphatase: 111 U/L (ref 38–126)
Anion gap: 15 (ref 5–15)
BILIRUBIN TOTAL: 2.5 mg/dL — AB (ref 0.3–1.2)
BUN: 64 mg/dL — ABNORMAL HIGH (ref 6–20)
CHLORIDE: 89 mmol/L — AB (ref 101–111)
CO2: 24 mmol/L (ref 22–32)
Calcium: 6.1 mg/dL — CL (ref 8.9–10.3)
Creatinine, Ser: 4.65 mg/dL — ABNORMAL HIGH (ref 0.44–1.00)
GFR calc Af Amer: 9 mL/min — ABNORMAL LOW (ref >60)
GFR, EST NON AFRICAN AMERICAN: 8 mL/min — AB (ref >60)
GLUCOSE: 102 mg/dL — AB (ref 65–99)
Potassium: 4.7 mmol/L (ref 3.5–5.1)
Sodium: 128 mmol/L — ABNORMAL LOW (ref 135–145)
Total Protein: 4.3 g/dL — ABNORMAL LOW (ref 6.5–8.1)

## 2015-02-07 LAB — GLUCOSE, CAPILLARY
GLUCOSE-CAPILLARY: 80 mg/dL (ref 65–99)
Glucose-Capillary: 99 mg/dL (ref 65–99)
Glucose-Capillary: 99 mg/dL (ref 65–99)

## 2015-02-07 LAB — BLOOD GAS, ARTERIAL
Acid-Base Excess: 0.4 mmol/L (ref 0.0–3.0)
BICARBONATE: 22.9 meq/L (ref 21.0–28.0)
FIO2: 0.4 %
LHR: 12 {breaths}/min
MECHANICAL RATE: 12
MECHVT: 450 mL
O2 Saturation: 87.6 %
PATIENT TEMPERATURE: 37
PEEP: 5 cmH2O
pCO2 arterial: 30 mmHg — ABNORMAL LOW (ref 32.0–48.0)
pH, Arterial: 7.49 — ABNORMAL HIGH (ref 7.350–7.450)
pO2, Arterial: 49 mmHg — ABNORMAL LOW (ref 83.0–108.0)

## 2015-02-07 LAB — MAGNESIUM: Magnesium: 1.3 mg/dL — ABNORMAL LOW (ref 1.7–2.4)

## 2015-02-07 LAB — VANCOMYCIN, TROUGH: Vancomycin Tr: 21 ug/mL — ABNORMAL HIGH (ref 10.0–20.0)

## 2015-02-07 LAB — PHOSPHORUS: Phosphorus: 6.2 mg/dL — ABNORMAL HIGH (ref 2.5–4.6)

## 2015-02-07 MED ORDER — MORPHINE SULFATE 2 MG/ML IJ SOLN
2.0000 mg | INTRAMUSCULAR | Status: DC | PRN
  Administered 2015-02-07: 2 mg via INTRAVENOUS
  Filled 2015-02-07: qty 1

## 2015-02-07 MED ORDER — GLYCOPYRROLATE 0.2 MG/ML IJ SOLN
0.2000 mg | INTRAMUSCULAR | Status: DC | PRN
  Administered 2015-02-07: 0.2 mg via INTRAVENOUS
  Filled 2015-02-07: qty 1

## 2015-02-07 MED ORDER — MAGNESIUM SULFATE 2 GM/50ML IV SOLN
2.0000 g | Freq: Once | INTRAVENOUS | Status: AC
  Administered 2015-02-07: 2 g via INTRAVENOUS
  Filled 2015-02-07: qty 50

## 2015-02-07 MED ORDER — VANCOMYCIN HCL IN DEXTROSE 750-5 MG/150ML-% IV SOLN
750.0000 mg | INTRAVENOUS | Status: DC
  Filled 2015-02-07: qty 150

## 2015-02-08 LAB — CULTURE, BLOOD (ROUTINE X 2)
CULTURE: NO GROWTH
CULTURE: NO GROWTH

## 2015-02-08 LAB — CULTURE, RESPIRATORY

## 2015-02-08 LAB — CULTURE, RESPIRATORY W GRAM STAIN

## 2015-02-09 MED FILL — Medication: Qty: 1 | Status: AC

## 2015-02-10 NOTE — Progress Notes (Signed)
Called results of Blood Cultures drawn on 02/06/2015. Positive blood cultures in Aerobic bottle. Gram negative rods. Lab made aware patient deceased.

## 2015-02-12 LAB — CULTURE, BLOOD (ROUTINE X 2)

## 2015-02-16 NOTE — Progress Notes (Signed)
MEDICATION RELATED CONSULT NOTE - Follow up   Pharmacy Consult for Constipation prevention while intubated in ICU and electrolytes Indication: ICU intubated  Allergies  Allergen Reactions  . Norvasc [Amlodipine Besylate] Swelling  . Vesicare [Solifenacin] Swelling  . Advair Diskus [Fluticasone-Salmeterol] Rash  . Sulfa Antibiotics Rash    Patient Measurements: Height:  (157.5 cm) Weight: 187 lb 6.3 oz (85 kg) IBW/kg (Calculated) : 50.1 Adjusted Body Weight:   Vital Signs: Temp: 98.7 F (37.1 C) (07/23 0500) Temp Source: Axillary (07/23 0500) BP: 93/44 mmHg (07/23 0700) Pulse Rate: 100 (07/23 0700) Intake/Output from previous day: 07/22 0701 - 07/23 0700 In: 1865.7 [I.V.:994.1; NG/GT:671.7; IV Piggyback:200] Out: 325 [Urine:325] Intake/Output from this shift:    Labs:  Recent Labs  02/05/15 0504 02/06/15 0434 02/23/15 0631  WBC 16.1* 13.9* 20.8*  HGB 9.8* 9.4* 8.5*  HCT 31.6* 28.5* 25.9*  PLT 158 135* 123*  CREATININE 3.08* 3.94* 4.65*  MG  --   --  1.3*  PHOS  --   --  6.2*  ALBUMIN 2.3* 2.2* 1.9*  PROT 4.7* 4.5* 4.3*  AST >2275* >2275* 1314*  ALT 2210* 2139* 1264*  ALKPHOS 87 94 111  BILITOT 1.0 1.7* 2.5*   Estimated Creatinine Clearance: 8.6 mL/min (by C-G formula based on Cr of 4.65).   Microbiology: Recent Results (from the past 720 hour(s))  MRSA PCR Screening     Status: None   Collection Time: 02/10/2015 11:43 AM  Result Value Ref Range Status   MRSA by PCR NEGATIVE NEGATIVE Final    Comment:        The GeneXpert MRSA Assay (FDA approved for NASAL specimens only), is one component of a comprehensive MRSA colonization surveillance program. It is not intended to diagnose MRSA infection nor to guide or monitor treatment for MRSA infections.   Culture, blood (routine x 2)     Status: None (Preliminary result)   Collection Time: 01/29/2015 12:11 PM  Result Value Ref Range Status   Specimen Description BLOOD LEFT ARM  Final   Special  Requests BOTTLES DRAWN AEROBIC AND ANAEROBIC 7CC  Final   Culture NO GROWTH 3 DAYS  Final   Report Status PENDING  Incomplete  Culture, blood (routine x 2)     Status: None (Preliminary result)   Collection Time: 01/29/2015  1:31 PM  Result Value Ref Range Status   Specimen Description BLOOD RARM  Final   Special Requests BOTTLES DRAWN AEROBIC AND ANAEROBIC  Final   Culture NO GROWTH 3 DAYS  Final   Report Status PENDING  Incomplete  C difficile quick scan w PCR reflex ([facility] only)     Status: None   Collection Time: 02/06/15  9:08 AM  Result Value Ref Range Status   C Diff antigen NEGATIVE NEGATIVE Final   C Diff toxin NEGATIVE NEGATIVE Final   C Diff interpretation Negative for C. difficile  Final  Culture, blood (routine x 2)     Status: None (Preliminary result)   Collection Time: 02/06/15 10:17 AM  Result Value Ref Range Status   Specimen Description BLOOD RIGHT ARM  Final   Special Requests BOTTLES DRAWN AEROBIC AND ANAEROBIC  2CC  Final   Culture  Setup Time   Final    GRAM NEGATIVE RODS IN BOTH AEROBIC AND ANAEROBIC BOTTLES CRITICAL RESULT CALLED TO, READ BACK BY AND VERIFIED WITH: JOY BRADDY ON 02-23-2015 AT 0250 BY TB CONFIRMED BY RWW/TB    Culture   Final  GRAM NEGATIVE RODS IDENTIFICATION AND SUSCEPTIBILITIES TO FOLLOW    Report Status PENDING  Incomplete  Culture, blood (routine x 2)     Status: None (Preliminary result)   Collection Time: 02/06/15 10:19 AM  Result Value Ref Range Status   Specimen Description BLOOD LEFT ARM  Final   Special Requests BOTTLES DRAWN AEROBIC ONLY  1CC  Final   Culture NO GROWTH < 12 HOURS  Final   Report Status PENDING  Incomplete  Culture, expectorated sputum-assessment     Status: None   Collection Time: 02/06/15  3:40 PM  Result Value Ref Range Status   Specimen Description EXPECTORATED SPUTUM  Final   Special Requests NONE  Final   Sputum evaluation THIS SPECIMEN IS ACCEPTABLE FOR SPUTUM CULTURE  Final   Report Status  02/06/2015 FINAL  Final    Medical History: Past Medical History  Diagnosis Date  . Hypertension   . Asthma   . Coronary artery disease     Medications:  Scheduled:  . antiseptic oral rinse  7 mL Mouth Rinse QID  . aspirin EC  81 mg Oral Daily  . chlorhexidine  15 mL Mouth Rinse BID  . feeding supplement (VITAL HIGH PROTEIN)  1,000 mL Per Tube Q24H  . heparin subcutaneous  5,000 Units Subcutaneous 3 times per day  . insulin aspart  0-9 Units Subcutaneous 6 times per day  . pantoprazole (PROTONIX) IV  40 mg Intravenous Q24H  . piperacillin-tazobactam (ZOSYN)  IV  3.375 g Intravenous Q12H  . pravastatin  40 mg Oral Daily  . ticagrelor  90 mg Oral BID  . Vancomycin  750 mg Intravenous Q72H    Assessment: 7/22: Nurse reported multiple loose stools this morning. Will hold stool softeners and laxatives for now. BM documented 7/22 7/23: Per RN pt still with loose stools  Electrolytes  K 4.7, Phos 6.2, Mag 1.3, Ca 6.1, Albumin 1.9, CorrCa 7.8 SCr 4.65   Goal of Therapy:  Prevention of constipation  Electrolytes WNL  Plan:  Will hold stool softeners and laxatives for now   Will order mag sulfate 2g IV x1 for this AM as pt still with loose stools Recheck labs in AM   Crist Fat L 02/08/2015,8:18 AM

## 2015-02-16 NOTE — Progress Notes (Signed)
ANTIBIOTIC CONSULT NOTE - FOLLOW UP   Pharmacy Consult for Vancomycin and Zosyn  Indication: rule out sepsis  Allergies  Allergen Reactions  . Norvasc [Amlodipine Besylate] Swelling  . Vesicare [Solifenacin] Swelling  . Advair Diskus [Fluticasone-Salmeterol] Rash  . Sulfa Antibiotics Rash    Patient Measurements: Height:  (157.5 cm) Weight: 180 lb 12.4 oz (82 kg) IBW/kg (Calculated) : 50.1   Vital Signs: Temp: 98.7 F (37.1 C) (07/22 2328) Temp Source: Axillary (07/22 2328) BP: 111/55 mmHg (07/23 0000) Pulse Rate: 114 (07/23 0000) Intake/Output from previous day: 07/22 0701 - 07/23 0700 In: 1345.3 [I.V.:748.7; NG/GT:446.7; IV Piggyback:150] Out: 225 [Urine:225] Intake/Output from this shift: Total I/O In: 427.5 [I.V.:157.5; NG/GT:270] Out: 100 [Urine:100]  Labs:  Recent Labs  02/04/15 1345 02/04/15 2028 02/05/15 0504 02/06/15 0434  WBC 37.7*  --  16.1* 13.9*  HGB 9.4*  --  9.8* 9.4*  PLT 184  --  158 135*  CREATININE 2.49* 2.65* 3.08* 3.94*   Estimated Creatinine Clearance: 10 mL/min (by C-G formula based on Cr of 3.94).  Recent Labs  02/04/15 2028 02/06/15 2210  VANCOTROUGH  --  21*  VANCORANDOM 15  --      Microbiology: Recent Results (from the past 720 hour(s))  MRSA PCR Screening     Status: None   Collection Time: 02/14/2015 11:43 AM  Result Value Ref Range Status   MRSA by PCR NEGATIVE NEGATIVE Final    Comment:        The GeneXpert MRSA Assay (FDA approved for NASAL specimens only), is one component of a comprehensive MRSA colonization surveillance program. It is not intended to diagnose MRSA infection nor to guide or monitor treatment for MRSA infections.   Culture, blood (routine x 2)     Status: None (Preliminary result)   Collection Time: 14-Feb-2015 12:11 PM  Result Value Ref Range Status   Specimen Description BLOOD LEFT ARM  Final   Special Requests BOTTLES DRAWN AEROBIC AND ANAEROBIC 7CC  Final   Culture NO GROWTH 3 DAYS   Final   Report Status PENDING  Incomplete  Culture, blood (routine x 2)     Status: None (Preliminary result)   Collection Time: 14-Feb-2015  1:31 PM  Result Value Ref Range Status   Specimen Description BLOOD RARM  Final   Special Requests BOTTLES DRAWN AEROBIC AND ANAEROBIC  Final   Culture NO GROWTH 3 DAYS  Final   Report Status PENDING  Incomplete  C difficile quick scan w PCR reflex ([facility] only)     Status: None   Collection Time: 02/06/15  9:08 AM  Result Value Ref Range Status   C Diff antigen NEGATIVE NEGATIVE Final   C Diff toxin NEGATIVE NEGATIVE Final   C Diff interpretation Negative for C. difficile  Final  Culture, blood (routine x 2)     Status: None (Preliminary result)   Collection Time: 02/06/15 10:17 AM  Result Value Ref Range Status   Specimen Description BLOOD RIGHT ARM  Final   Special Requests BOTTLES DRAWN AEROBIC AND ANAEROBIC  2CC  Final   Culture NO GROWTH < 12 HOURS  Final   Report Status PENDING  Incomplete  Culture, blood (routine x 2)     Status: None (Preliminary result)   Collection Time: 02/06/15 10:19 AM  Result Value Ref Range Status   Specimen Description BLOOD LEFT ARM  Final   Special Requests BOTTLES DRAWN AEROBIC ONLY  1CC  Final   Culture NO GROWTH <  12 HOURS  Final   Report Status PENDING  Incomplete  Culture, expectorated sputum-assessment     Status: None   Collection Time: 02/06/15  3:40 PM  Result Value Ref Range Status   Specimen Description EXPECTORATED SPUTUM  Final   Special Requests NONE  Final   Sputum evaluation THIS SPECIMEN IS ACCEPTABLE FOR SPUTUM CULTURE  Final   Report Status 02/06/2015 FINAL  Final    Medical History: Past Medical History  Diagnosis Date  . Hypertension   . Asthma   . Coronary artery disease     Medications:  Scheduled:  . antiseptic oral rinse  7 mL Mouth Rinse QID  . aspirin EC  81 mg Oral Daily  . chlorhexidine  15 mL Mouth Rinse BID  . feeding supplement (VITAL HIGH PROTEIN)  1,000 mL  Per Tube Q24H  . heparin subcutaneous  5,000 Units Subcutaneous 3 times per day  . insulin aspart  0-9 Units Subcutaneous 6 times per day  . pantoprazole (PROTONIX) IV  40 mg Intravenous Q24H  . piperacillin-tazobactam (ZOSYN)  IV  3.375 g Intravenous Q12H  . pravastatin  40 mg Oral Daily  . ticagrelor  90 mg Oral BID  . Vancomycin  750 mg Intravenous Q72H   Infusions:  . sodium chloride 10 mL/hr at 02/06/15 1044  . epinephrine 0.5 mcg/min (02/06/15 2300)  . free water    . norepinephrine (LEVOPHED) Adult infusion 25 mcg/min (02/06/15 2300)  . propofol (DIPRIVAN) infusion Stopped (02/06/15 0700)   PRN: acetaminophen, fentaNYL (SUBLIMAZE) injection, fentaNYL (SUBLIMAZE) injection, nitroGLYCERIN, ondansetron (ZOFRAN) IV, senna-docusate  Assessment: 81 y/o F admitted with SOB, STEMI, elevated lactic acid level ordered empiric abx for r/o sepsis. Patient with elevated SCR, AKI so level was ordered subsequent to the first dose. The level is virtually meaningless except to r/o toxicity as this would be nowhere near steady state.   7/22: Patient's renal function continues to diminish. Currently serum creatinine is 3.94 mg/dl;    Goal of Therapy:  Vancomycin trough level 15-20 mcg/ml  Plan:  Zosyn: Will continue Zosyn 3.375 g IV q12 hours.  Random level ordered to be drawn ~48 hours post dose to assess for toxicity. Patient likely will need to be dosed per levels based on renal function decline. Vancomycin 750 mg IV q48 hours dose has been discontinued. Will need to follow up and restart based on results of random level. Scr ordered with am labs.   7/22 Vancomycin random at 2210= 21 mcg/ml. Will schedule Vancomycin 750mg  IV Q72 hrs. Appears last dose was given 7/20 at 2203. Will schedule next dose for 7/23 at 2200. If renal fxn contiunes to decline then will need to reevaluate dosing.  Bari Mantis PharmD Clinical Pharmacist 02/15/2015

## 2015-02-16 NOTE — Plan of Care (Signed)
Problem: Phase I Progression Outcomes Goal: Hemodynamically stable Outcome: Progressing Patient has been weaned off of vasopressin and epinephrine drips.  Levophed currently infusing.

## 2015-02-16 NOTE — Progress Notes (Signed)
Called and spoke with [place] Ouch with [employer] in regards to pt's pacemaker. He stated that they do not come by to use the magnet with a pt with a permanent pacemaker. He stated that after pt passes, the build up of toxins in body will keep the pacemaker from firing. He stated we would then use a magnet to shut off pacemaker. Derald Macleod, RN

## 2015-02-16 NOTE — Progress Notes (Signed)
[facility] Cardiology  SUBJECTIVE: Intubated   Filed Vitals:   02/10/2015 0600 01/28/2015 0700 02/11/2015 0800 01/17/2015 0900  BP: 97/64 93/44 102/56 108/66  Pulse: 100 100 101 97  Temp:   98.8 F (37.1 C)   TempSrc:   Oral   Resp: Height:      Weight:      SpO2: 100% 99% 100% 100%     Intake/Output Summary (Last 24 hours) at 02/06/2015 1034 Last data filed at 01/16/2015 0843  Gross per 24 hour  Intake 1937.73 ml  Output    325 ml  Net 1612.73 ml      PHYSICAL EXAM  General: Intubated HEENT:  Intubated Neck:  No JVD.  Lungs: Clear bilaterally to auscultation and percussion. Heart: HRRR . Normal S1 and S2 without gallops or murmurs.  Abdomen: Bowel sounds are positive, abdomen soft and non-tender  Msk:  Back normal, normal gait. Normal strength and tone for age. Extremities: No clubbing, cyanosis or edema.   Neuro: Sedated Psych:  Sedated   LABS: Basic Metabolic Panel:  Recent Labs  16/10/96 0434 01/17/2015 0631  NA 130* 128*  K 4.5 4.7  CL 93* 89*  CO2 23 24  GLUCOSE 132* 102*  BUN 50* 64*  CREATININE 3.94* 4.65*  CALCIUM 6.1* 6.1*  MG  --  1.3*  PHOS  --  6.2*   Liver Function Tests:  Recent Labs  02/06/15 0434 02/15/2015 0631  AST >2275* 1314*  ALT 2139* 1264*  ALKPHOS 94 111  BILITOT 1.7* 2.5*  PROT 4.5* 4.3*  ALBUMIN 2.2* 1.9*   No results for input(s): LIPASE, AMYLASE in the last 72 hours. CBC:  Recent Labs  02/06/15 0434 01/19/2015 0631  WBC 13.9* 20.8*  NEUTROABS 12.9* 19.0*  HGB 9.4* 8.5*  HCT 28.5* 25.9*  MCV 87.6 87.2  PLT 135* 123*   Cardiac Enzymes:  Recent Labs  02/04/15 1345  TROPONINI >65.00*   BNP: Invalid input(s): POCBNP D-Dimer: No results for input(s): DDIMER in the last 72 hours. Hemoglobin A1C: No results for input(s): HGBA1C in the last 72 hours. Fasting Lipid Panel:  Recent Labs  02/05/15 0504  TRIG 69   Thyroid Function Tests: No results for input(s): TSH, T4TOTAL, T3FREE, THYROIDAB in the last  72 hours.  Invalid input(s): FREET3 Anemia Panel: No results for input(s): VITAMINB12, FOLATE, FERRITIN, TIBC, IRON, RETICCTPCT in the last 72 hours.  Dg Chest 1 View  02/10/2015   CLINICAL DATA:  Dyspnea  EXAM: CHEST  1 VIEW  COMPARISON:  02/06/2015  FINDINGS: Endotracheal tube is again noted in satisfactory position. The nasogastric catheter is seen within the stomach. Left jugular central line is again seen in satisfactory position. Cardiac shadow is stable. A pacing device is again noted and stable. Persistent bibasilar infiltrates right greater than left are seen. The overall appearance given some variation in the imaging technique is stable. No new focal abnormality is noted.  IMPRESSION: Persistent bibasilar infiltrates right greater than left.  Tubes and lines as described.   Electronically Signed   By: Alcide Clever M.D.   On: 01/24/2015 07:48   Dg Chest 1 View  02/06/2015   CLINICAL DATA:  Shortness of breath.  Intubation.  EXAM: CHEST  1 VIEW  COMPARISON:  02/04/2015.  CT 09/19/2014 .  FINDINGS: Surgical clips are in the right neck. Endotracheal tube, NG tube, left IJ line in stable position. Cardiac pacer with lead tips in right atrium right ventricle. Heart size and  pulmonary vascularity normal. Progressive bibasilar atelectasis and/or infiltrates. Small right pleural effusion. No pneumothorax.  IMPRESSION: 1. Lines and tubes in stable position. 2. Low lung volumes with progressive bibasilar atelectasis and/or infiltrates. Small right pleural effusion.   Electronically Signed   By: Maisie Fus  Register   On: 02/06/2015 07:38     Echo normal left ventricular function with mild mitral regurgitation  TELEMETRY: Atrial fibrillation with a controlled ventricular rate:  ASSESSMENT AND PLAN:  Active Problems:   Myocardial infarction acute   Acute respiratory failure   Cardiac arrest   Hypotension   Cardiogenic shock   Acute renal failure   Acute encephalopathy   Shock liver   Severe  sepsis with septic shock    81 year old female who presented with acute inferior non-ST elevation myocardial infarction, underwent successful PCI with stent ostium the right coronary artery, experienced cardiopulmonary arrest, possible aspiration pneumonia, complicated by CHF, echocardiogram revealing normal left ventricular function, with only mild mitral regurgitation. However, follow-up troponin was greater than 65 suggesting large infarction. The patient has remained hypoxic, hypotensive, requiring pressure support, ventilatory support. The patient has also developed worsening renal function. The patient also has developed bloody stools with worsening anemia. Patient has multiorgan failure with very poor prognosis.  Recommendations  1. Hold aspirin and Brilinta for now due to GI bleed 2. Consider repeat 2-D echocardiogram to evaluate left ventricular dysfunction, as well as, papillary muscle dysfunction or rupture 3. Continue critical care for now, may need to consider supportive care in light of patient's poor prognosis   Reyli Schroth, MD, PhD, [facility] 02/09/2015 10:34 AM

## 2015-02-16 NOTE — Progress Notes (Signed)
PATIENT STOOL HAS TURNED FROM BROWN TO MAROON OVER COURSE OF THE NIGHT SHIFT.  DR DIAMOND WAS PAGED AND QUESTIONED WHETHER TO GIVE SUBCUTANEOUS HEPARIN OR HOLD.  ORDER TO GIVE HEPARIN AS ORDERED.

## 2015-02-16 NOTE — Progress Notes (Signed)
   02/04/2015 1135  Clinical Encounter Type  Visited With Patient and family together  Visit Type Initial  Referral From Family  Consult/Referral To Chaplain  Spiritual Encounters  Spiritual Needs Prayer  Stress Factors  Family Stress Factors Health changes  Chaplain paged to unit for prayer and spiritual support for family as they prepare for patient to transition off of life support.  Chaplain Aerika Groll [phone]

## 2015-02-16 NOTE — Progress Notes (Signed)
Paged prime doctor on call for notification of pt expiring. Dr. Elisabeth Pigeon notified and stated nurse could place his name down. Krystalyn Kubota, Charity fundraiser.

## 2015-02-16 NOTE — Progress Notes (Signed)
   02/01/2015 1351  Clinical Encounter Type  Visited With Family  Visit Type Patient actively dying  Referral From Nurse  Consult/Referral To Chaplain  Stress Factors  Family Stress Factors None identified  Chaplain paged to family room to be with family. Daughter didn't desire chaplain to be in room and indicated she would have me paged if they need me.   [employer] Matei Magnone [phone]

## 2015-02-16 NOTE — Progress Notes (Signed)
Removed three metal rings per pt's daughter request due to swelling of pt's fingers. Gave daughter Forest Gleason) 3 rings. She stated she would place in her purse to take home. Derald Macleod, RN

## 2015-02-16 NOTE — Progress Notes (Signed)
Patient is placed on high fowlers positio, cuff deflated, suctioned orally and endotracheally and then extubated to 4 lpm O2 [place]

## 2015-02-16 NOTE — Progress Notes (Signed)
[facility] [place] [facility]   PATIENT NAME: Sophia Bradley    MR#:  [id number]  DATE OF BIRTH:  18-Aug-1925  SUBJECTIVE: Seen today patient is critically illnow having GI bleed, having rectal bleed. Renal function continued to worsen. She is on  Levophed.   CHIEF COMPLAINT:   Chief Complaint  Patient presents with  . Shortness of Breath   T REVIEW OF SYSTEMS:  unable to obtain. Due to intubation and sedation.  DRUG ALLERGIES:   Allergies  Allergen Reactions  . Norvasc [Amlodipine Besylate] Swelling  . Vesicare [Solifenacin] Swelling  . Advair Diskus [Fluticasone-Salmeterol] Rash  . Sulfa Antibiotics Rash    VITALS:  Blood pressure 108/66, pulse 97, temperature 98.8 F (37.1 C), temperature source Oral, resp. rate 24, height 5\' 2"  (1.575 m), weight 85 kg (187 lb 6.3 oz), SpO2 100 %.  PHYSICAL EXAMINATION:  GENERAL:  81 y.o.-year-old patient lying in the bed, intubated and on Ventilation. Patient has generalized anasarca EYES: Pupils equal, round, reactive to light and accommodation. No scleral icterus. Extraocular muscles intact.  HEENT: Head atraumatic, normocephalic. On intubation and ventilation.  NECK:  Supple, no jugular venous distention. No thyroid enlargement, no tenderness.  LUNGS: Normal breath sounds bilaterally, bibasal crepitations present CARDIOVASCULAR: S1, S2 normal. No murmurs, rubs, or gallops.  ABDOMEN: Soft, nontender, nondistended. Bowel sounds present. No organomegaly or mass.  EXTREMITIES: Bilateral pedal edema, upper extremity edema present.  NEUROLOGIC: Unresponsive on ventilation. PSYCHIATRIC: Unresponsive on ventilation. SKIN: No obvious rash, lesion, or ulcer.    LABORATORY PANEL:   CBC  Recent Labs Lab 01/30/2015 0631  WBC 20.8*  HGB 8.5*  HCT 25.9*  PLT 123*   ------------------------------------------------------------------------------------------------------------------  Chemistries   Recent  Labs Lab 02/11/2015 0631  NA 128*  K 4.7  CL 89*  CO2 24  GLUCOSE 102*  BUN 64*  CREATININE 4.65*  CALCIUM 6.1*  MG 1.3*  AST 1314*  ALT 1264*  ALKPHOS 111  BILITOT 2.5*   ------------------------------------------------------------------------------------------------------------------  Cardiac Enzymes  Recent Labs Lab 02/04/15 1345  TROPONINI >65.00*   ------------------------------------------------------------------------------------------------------------------  RADIOLOGY:  Dg Chest 1 View  01/20/2015   CLINICAL DATA:  Dyspnea  EXAM: CHEST  1 VIEW  COMPARISON:  02/06/2015  FINDINGS: Endotracheal tube is again noted in satisfactory position. The nasogastric catheter is seen within the stomach. Left jugular central line is again seen in satisfactory position. Cardiac shadow is stable. A pacing device is again noted and stable. Persistent bibasilar infiltrates right greater than left are seen. The overall appearance given some variation in the imaging technique is stable. No new focal abnormality is noted.  IMPRESSION: Persistent bibasilar infiltrates right greater than left.  Tubes and lines as described.   Electronically Signed   By: Alcide Clever M.D.   On: 01/26/2015 07:48   Dg Chest 1 View  02/06/2015   CLINICAL DATA:  Shortness of breath.  Intubation.  EXAM: CHEST  1 VIEW  COMPARISON:  02/04/2015.  CT 09/19/2014 .  FINDINGS: Surgical clips are in the right neck. Endotracheal tube, NG tube, left IJ line in stable position. Cardiac pacer with lead tips in right atrium right ventricle. Heart size and pulmonary vascularity normal. Progressive bibasilar atelectasis and/or infiltrates. Small right pleural effusion. No pneumothorax.  IMPRESSION: 1. Lines and tubes in stable position. 2. Low lung volumes with progressive bibasilar atelectasis and/or infiltrates. Small right pleural effusion.   Electronically Signed   By: Maisie Fus  Register   On: 02/06/2015 07:38  EKG:   Orders  placed or performed during the hospital encounter of 01/30/2015  . EKG 12-Lead immediately post procedure  . EKG 12-Lead  . EKG 12-Lead immediately post procedure  . EKG 12-Lead  . EKG 12-Lead  . EKG 12-Lead  . EKG 12-Lead  . EKG 12-Lead  . EKG 12-Lead  . EKG 12-Lead  . EKG 12-Lead  . EKG 12-Lead  . EKG 12-Lead  . EKG 12-Lead    ASSESSMENT AND PLAN:   * 1.Acute respiratory failure with hypoxia. Likely due to CHF\and acute MI./And bibasilar pneumonia.  The new full vent support, broad-spectrum IV antibiotics.Critical care is following  * 2.Possible aspiration pneumonia continue Zosyn, worsening WBC. 3. Non- ST elevation MI. Troponin>65. S/p PCI, to RCA, hold aspirin, Benadryl intact, Plavix secondary to GI bleed.  4.. ARF likely due to ATN, with hypotension;worsening renal function, patient needs CRRT  But  patient's daughter is not quite ready at ,continue bicarbonate drip , due to multiorgan failure family , recommend comfort care measure.  5.Shock liver with elevated LFTs follow closely, monitor daily LFTs. Dr. liver secondary to cardiac arrest  6. Cardiac arrest status post resuscitation: rpt  echocardiogram showed EF of 50% with the inferior wall hypokinesia possible left papillary muscle dysfunction, patient is at risk for ventricular capture and sudden cardiac death. Appreciate Dr. Philemon Kingdom input. #7 GI bleed: Patient developed rectal bleed likely due to ongoing shock liver, renal failure, respiratory failure. Condition is very critical ,prognosis is very poor .discussed with the patient's daughter at bedside today. Condition; critical  high risk for cardiac arrest   CODE STATUS;do  NOT RESUSCITATE    All the records are reviewed and case discussed with Care Management/Social Workerr. Management plans discussed with the patient, family and they are in agreement.  CODE STATUS:DNR Critical care time;35 min  Rashmi Tallent M.D on 02/09/2015 at 11:32 AM  Between  7am to 6pm - Pager - [phone]  After 6pm go to [email or web address] - password [email or web address] [facility]  [facility]  Office  [phone]  CC: Primary care physician; Jerl Mina, MD

## 2015-02-16 NOTE — Progress Notes (Signed)
Dr. Luberta Mutter called and informed pt's daughter ready to withdrawal care on pt. Pt extubated at 1345. Pt tolerated well. 2L [place] applied. All drips discontinued. Derald Macleod, RN

## 2015-02-16 NOTE — Progress Notes (Signed)
Paged Dr. Luberta Mutter a total of 4 times now still awaiting call back to inform her of Pt expired. Derald Macleod, RN

## 2015-02-16 NOTE — Progress Notes (Signed)
ANTIBIOTIC CONSULT NOTE - FOLLOW UP   Pharmacy Consult for Vancomycin and Zosyn  Indication: rule out sepsis  Allergies  Allergen Reactions  . Norvasc [Amlodipine Besylate] Swelling  . Vesicare [Solifenacin] Swelling  . Advair Diskus [Fluticasone-Salmeterol] Rash  . Sulfa Antibiotics Rash    Patient Measurements: Height:  (157.5 cm) Weight: 187 lb 6.3 oz (85 kg) IBW/kg (Calculated) : 50.1   Vital Signs: Temp: 98.7 F (37.1 C) (07/23 0500) Temp Source: Axillary (07/23 0500) BP: 93/44 mmHg (07/23 0700) Pulse Rate: 100 (07/23 0700) Intake/Output from previous day: 07/22 0701 - 07/23 0700 In: 1865.7 [I.V.:994.1; NG/GT:671.7; IV Piggyback:200] Out: 325 [Urine:325] Intake/Output from this shift:    Labs:  Recent Labs  02/05/15 0504 02/06/15 0434 05-Mar-2015 0631  WBC 16.1* 13.9* 20.8*  HGB 9.8* 9.4* 8.5*  PLT 158 135* 123*  CREATININE 3.08* 3.94* 4.65*   Estimated Creatinine Clearance: 8.6 mL/min (by C-G formula based on Cr of 4.65).  Recent Labs  02/04/15 2028 02/06/15 2210  VANCOTROUGH  --  21*  VANCORANDOM 15  --      Microbiology: Recent Results (from the past 720 hour(s))  MRSA PCR Screening     Status: None   Collection Time: 01/29/2015 11:43 AM  Result Value Ref Range Status   MRSA by PCR NEGATIVE NEGATIVE Final    Comment:        The GeneXpert MRSA Assay (FDA approved for NASAL specimens only), is one component of a comprehensive MRSA colonization surveillance program. It is not intended to diagnose MRSA infection nor to guide or monitor treatment for MRSA infections.   Culture, blood (routine x 2)     Status: None (Preliminary result)   Collection Time: 02/08/2015 12:11 PM  Result Value Ref Range Status   Specimen Description BLOOD LEFT ARM  Final   Special Requests BOTTLES DRAWN AEROBIC AND ANAEROBIC 7CC  Final   Culture NO GROWTH 3 DAYS  Final   Report Status PENDING  Incomplete  Culture, blood (routine x 2)     Status: None  (Preliminary result)   Collection Time: 02/10/2015  1:31 PM  Result Value Ref Range Status   Specimen Description BLOOD RARM  Final   Special Requests BOTTLES DRAWN AEROBIC AND ANAEROBIC  Final   Culture NO GROWTH 3 DAYS  Final   Report Status PENDING  Incomplete  C difficile quick scan w PCR reflex ([facility] only)     Status: None   Collection Time: 02/06/15  9:08 AM  Result Value Ref Range Status   C Diff antigen NEGATIVE NEGATIVE Final   C Diff toxin NEGATIVE NEGATIVE Final   C Diff interpretation Negative for C. difficile  Final  Culture, blood (routine x 2)     Status: None (Preliminary result)   Collection Time: 02/06/15 10:17 AM  Result Value Ref Range Status   Specimen Description BLOOD RIGHT ARM  Final   Special Requests BOTTLES DRAWN AEROBIC AND ANAEROBIC  2CC  Final   Culture  Setup Time   Final    GRAM NEGATIVE RODS IN BOTH AEROBIC AND ANAEROBIC BOTTLES CRITICAL RESULT CALLED TO, READ BACK BY AND VERIFIED WITH: JOY BRADDY ON March 05, 2015 AT 0250 BY TB CONFIRMED BY RWW/TB    Culture NO GROWTH < 12 HOURS  Final   Report Status PENDING  Incomplete  Culture, blood (routine x 2)     Status: None (Preliminary result)   Collection Time: 02/06/15 10:19 AM  Result Value Ref Range Status  Specimen Description BLOOD LEFT ARM  Final   Special Requests BOTTLES DRAWN AEROBIC ONLY  1CC  Final   Culture NO GROWTH < 12 HOURS  Final   Report Status PENDING  Incomplete  Culture, expectorated sputum-assessment     Status: None   Collection Time: 02/06/15  3:40 PM  Result Value Ref Range Status   Specimen Description EXPECTORATED SPUTUM  Final   Special Requests NONE  Final   Sputum evaluation THIS SPECIMEN IS ACCEPTABLE FOR SPUTUM CULTURE  Final   Report Status 02/06/2015 FINAL  Final    Medical History: Past Medical History  Diagnosis Date  . Hypertension   . Asthma   . Coronary artery disease     Medications:  Scheduled:  . antiseptic oral rinse  7 mL Mouth Rinse QID  .  aspirin EC  81 mg Oral Daily  . chlorhexidine  15 mL Mouth Rinse BID  . feeding supplement (VITAL HIGH PROTEIN)  1,000 mL Per Tube Q24H  . heparin subcutaneous  5,000 Units Subcutaneous 3 times per day  . insulin aspart  0-9 Units Subcutaneous 6 times per day  . pantoprazole (PROTONIX) IV  40 mg Intravenous Q24H  . piperacillin-tazobactam (ZOSYN)  IV  3.375 g Intravenous Q12H  . pravastatin  40 mg Oral Daily  . ticagrelor  90 mg Oral BID  . Vancomycin  750 mg Intravenous Q72H   Infusions:  . sodium chloride 10 mL/hr at 02/06/15 1044  . epinephrine 0.5 mcg/min (02/06/15 2300)  . free water    . norepinephrine (LEVOPHED) Adult infusion 20 mcg/min (01/28/2015 0500)  . propofol (DIPRIVAN) infusion Stopped (02/06/15 0700)   PRN: acetaminophen, fentaNYL (SUBLIMAZE) injection, fentaNYL (SUBLIMAZE) injection, nitroGLYCERIN, ondansetron (ZOFRAN) IV, senna-docusate  Assessment: 81 y/o F admitted with SOB, STEMI, elevated lactic acid level ordered empiric abx for r/o sepsis. Patient with elevated SCR, AKI so level was ordered subsequent to the first dose. The level is virtually meaningless except to r/o toxicity as this would be nowhere near steady state.   7/22: Patient's renal function continues to diminish. Currently serum creatinine is 3.94 mg/dl;  0/93 SCr 2.35   Goal of Therapy:  Vancomycin trough level 15-20 mcg/ml  Plan:  Zosyn: Will continue Zosyn 3.375 g IV q12 hours.  Vancomycin: Random level ordered to be drawn ~48 hours post last dose to assess for toxicity. Patient likely will need to be dosed per levels based on renal function decline. Vancomycin 750 mg IV q48 hours dose has been discontinued. Will need to follow up and restart based on results of random level. Scr ordered with am labs.   7/22 Vancomycin random at 2210= 21 mcg/ml. Will schedule Vancomycin 750mg  IV Q72 hrs. Appears last dose was given 7/20 at 2203. Will schedule next dose for 7/23 at 2200. If renal fxn contiunes  to decline then will need to reevaluate dosing.  7/23 Will order a confirmatory vancomycin random level at 2100 tonight to ensure pt has cleared vancomycin to goal level of 15-20 mcg/ml as previous level of 21 and worsening SCr.    Crist Fat, PharmD, BCPS Clinical Pharmacist 02/02/2015 8:10 AM

## 2015-02-16 NOTE — Discharge Summary (Signed)
Sophia Bradley, is a 81 y.o. female  DOB 18-Jun-1926  MRN [id number].  Admission date:  02/05/2015  Admitting Physician  Katha Hamming, MD  Discharge Date:  02/06/2015   Primary MD  Jerl Mina, MD  Recommendations for primary care physician for things to follow:  Patient  expired   Admission Diagnosis  Dehydration [E86.0] Exertional dyspnea [R06.09] Abrasions of multiple sites [T14.8] Hypotension, unspecified hypotension type [I95.9]   Discharge Diagnosis  Dehydration [E86.0] Exertional dyspnea [R06.09] Abrasions of multiple sites [T14.8] Hypotension, unspecified hypotension type [I95.9]    Active Problems:   Myocardial infarction acute   Acute respiratory failure   Cardiac arrest   Hypotension   Cardiogenic shock   Acute renal failure   Acute encephalopathy   Shock liver   Severe sepsis with septic shock      Past Medical History  Diagnosis Date  . Hypertension   . Asthma   . Coronary artery disease     Past Surgical History  Procedure Laterality Date  . Pacemaker insertion    . Kidney stent    . Coronary angioplasty with stent placement    . Cardiac catheterization N/A 02/05/15    Procedure: Left Heart Cath;  Surgeon: Lamar Blinks, MD;  Location: [facility] INVASIVE CV LAB;  Service: Cardiovascular;  Laterality: N/A;  . Cardiac catheterization N/A 2015/02/05    Procedure: Coronary Stent Intervention;  Surgeon: Marcina Millard, MD;  Location: [facility] INVASIVE CV LAB;  Service: Cardiovascular;  Laterality: N/A;       History of present illness and  Hospital Course:     Kindly see H&P for history of present illness and admission details, please review complete Labs, Consult reports and Test reports for all details in brief  HPI  from the history and physical done on the day of  admission 81 year old female patient with hypertension, peripheral vascular disease, coronary artery disease came in because of shortness of breath, chest tightness, nausea and vomiting. Patient also was hypotensive on admission. Admitted to intensive care unit for  Non ST elevation MI.   Hospital Course  #1: Chest pain nausea vomiting hypotension secondary to  Non ST elevation MI.  patient was taken to emergency cardiac catheter and the troponins initially were negative and the following troponins went up to 65 cardiac catheter revealed EF of 50% , 100% stenosis of right coronary artery and the patient had a PCI and stent placement in  RCA. Moved to ICU started on   Plavix. ,brillinta.On June 2 and July 20 patient developed the acute respiratory distress with hypotension  And became unresponsive,.Patient  statred  on Levophed, started on ventilator. Patient required a Levophed, epinephrine drips. Patient had a repeat echocardiogram done for possible papillary muscle dysfunction causing the hypotension and respiratory failure. Which di not show. Patient also developed shock liver, worsening renal failure. Her LFTs ,ASt,ALT  2000 range and also she had progressive renal failure with creatinine up to 4.6.  Seen by Nephrology, he offered CRRT to the family but the patient's daughter and other family members decided to terminally extubate and then the pursue comfort care. Patient has multiorgan failure with cardiac and respiratory and renal failures. Patient expired on July 23 at the 5:55 PM     Major procedures and Radiology Reports - PLEASE review detailed and final reports for all details, in brief -     Dg Chest 1 View  02/01/2015   CLINICAL DATA:  Dyspnea  EXAM: CHEST  1 VIEW  COMPARISON:  02/06/2015  FINDINGS: Endotracheal tube is again noted in satisfactory position. The nasogastric catheter is seen within the stomach. Left jugular central line is again seen in satisfactory position. Cardiac shadow  is stable. A pacing device is again noted and stable. Persistent bibasilar infiltrates right greater than left are seen. The overall appearance given some variation in the imaging technique is stable. No new focal abnormality is noted.  IMPRESSION: Persistent bibasilar infiltrates right greater than left.  Tubes and lines as described.   Electronically Signed   By: Alcide Clever M.D.   On: 2015/02/21 07:48   Dg Chest 1 View  02/06/2015   CLINICAL DATA:  Shortness of breath.  Intubation.  EXAM: CHEST  1 VIEW  COMPARISON:  02/04/2015.  CT 09/19/2014 .  FINDINGS: Surgical clips are in the right neck. Endotracheal tube, NG tube, left IJ line in stable position. Cardiac pacer with lead tips in right atrium right ventricle. Heart size and pulmonary vascularity normal. Progressive bibasilar atelectasis and/or infiltrates. Small right pleural effusion. No pneumothorax.  IMPRESSION: 1. Lines and tubes in stable position. 2. Low lung volumes with progressive bibasilar atelectasis and/or infiltrates. Small right pleural effusion.   Electronically Signed   By: Maisie Fus  Register   On: 02/06/2015 07:38   Dg Chest 1 View  02/04/2015   CLINICAL DATA:  PICC line placement.  EXAM: CHEST  1 VIEW  COMPARISON:  Chest x-ray dated 02/15/2015.  FINDINGS: I do not see a right arm or left arm PICC line. There is a partially visualized line overlying the left lung apex which may have been inserted via the left internal jugular vein. If so, the line terminates in the expected region of the lower left internal jugular vein or left brachiocephalic vein.  Cardiomediastinal silhouette is grossly stable in size and configuration. There is now diffuse bilateral interstitial edema. There is a probable small right pleural effusion blunting the right costophrenic angle. No pneumothorax.  IMPRESSION: No right arm or left arm PICC line identified. A partially visualized line overlying the left lung apex may have been inserted via the left internal  jugular vein. If this is the line in question, its tip terminates in the expected region of the lower left internal jugular vein or left brachiocephalic vein and advancement would be recommended.  No pneumothorax seen.  New diffuse bilateral interstitial edema suggesting volume overload/ congestive heart failure.  Probable trace right pleural effusion.  Stable mild cardiomegaly.   Electronically Signed   By: Bary Richard M.D.   On: 02/04/2015 13:21   Dg Chest 1 View  02/08/2015   CLINICAL DATA:  Shortness of breath, near syncope  EXAM: CHEST  1 VIEW  COMPARISON:  Portable exam 1232 hours compared to 10/10/2012  FINDINGS: LEFT subclavian transvenous pacemaker with leads projecting over RIGHT atrium and RIGHT ventricle, unchanged.  Borderline enlargement of cardiac silhouette.  Atherosclerotic calcification aorta.  Mediastinal contours and pulmonary vascularity normal.  Lungs clear.  No pleural effusion or pneumothorax.  Bones demineralized with probable BILATERAL chronic rotator cuff tears.  Surgical clips in the RIGHT cervical region.  IMPRESSION: No acute abnormalities.   Electronically Signed   By: Ulyses Southward M.D.   On: 02/15/2015 12:43   Dg Abd 1 View  02/04/2015   CLINICAL DATA:  OG tube placement.  EXAM: ABDOMEN - 1 VIEW  COMPARISON:  09/20/2014  FINDINGS: The OG tube tip is in the distal stomach.  Normal bowel gas pattern.  No osseous abnormality.  IMPRESSION: OG tube tip in the distal stomach.   Electronically Signed   By: Francene Boyers M.D.   On: 02/04/2015 15:34    Micro Results     Recent Results (from the past 240 hour(s))  MRSA PCR Screening     Status: None   Collection Time: 02/06/2015 11:43 AM  Result Value Ref Range Status   MRSA by PCR NEGATIVE NEGATIVE Final    Comment:        The GeneXpert MRSA Assay (FDA approved for NASAL specimens only), is one component of a comprehensive MRSA colonization surveillance program. It is not intended to diagnose MRSA infection nor to  guide or monitor treatment for MRSA infections.   Culture, blood (routine x 2)     Status: None   Collection Time: 01/27/2015 12:11 PM  Result Value Ref Range Status   Specimen Description BLOOD LEFT ARM  Final   Special Requests BOTTLES DRAWN AEROBIC AND ANAEROBIC 7CC  Final   Culture NO GROWTH 5 DAYS  Final   Report Status 02/08/2015 FINAL  Final  Culture, blood (routine x 2)     Status: None   Collection Time: 01/25/2015  1:31 PM  Result Value Ref Range Status   Specimen Description BLOOD RARM  Final   Special Requests BOTTLES DRAWN AEROBIC AND ANAEROBIC  Final   Culture NO GROWTH 5 DAYS  Final   Report Status 02/08/2015 FINAL  Final  C difficile quick scan w PCR reflex ([facility] only)     Status: None   Collection Time: 02/06/15  9:08 AM  Result Value Ref Range Status   C Diff antigen NEGATIVE NEGATIVE Final   C Diff toxin NEGATIVE NEGATIVE Final   C Diff interpretation Negative for C. difficile  Final  Culture, blood (routine x 2)     Status: None   Collection Time: 02/06/15 10:17 AM  Result Value Ref Range Status   Specimen Description BLOOD RIGHT ARM  Final   Special Requests BOTTLES DRAWN AEROBIC AND ANAEROBIC  2CC  Final   Culture  Setup Time   Final    GRAM NEGATIVE RODS IN BOTH AEROBIC AND ANAEROBIC BOTTLES CRITICAL RESULT CALLED TO, READ BACK BY AND VERIFIED WITH: JOY BRADDY ON 2015/03/07 AT 0250 BY TB CONFIRMED BY RWW/TB    Culture KLEBSIELLA PNEUMONIAE  Final   Report Status 02/08/2015 FINAL  Final   Organism ID, Bacteria KLEBSIELLA PNEUMONIAE  Final      Susceptibility   Klebsiella pneumoniae - MIC*    AMPICILLIN >=32 RESISTANT Resistant     CEFTAZIDIME 4 SENSITIVE Sensitive     CEFAZOLIN >=64 RESISTANT Resistant     CEFTRIAXONE <=1 SENSITIVE Sensitive     CIPROFLOXACIN <=0.25 SENSITIVE Sensitive     GENTAMICIN <=1 SENSITIVE Sensitive     IMIPENEM <=0.25 SENSITIVE Sensitive     TRIMETH/SULFA <=20 SENSITIVE Sensitive     PIP/TAZO Value in next row Resistant       RESISTANT>=128    * KLEBSIELLA PNEUMONIAE  Culture, blood (routine x 2)     Status: None (Preliminary result)   Collection Time: 02/06/15 10:19 AM  Result Value Ref Range Status   Specimen Description BLOOD LEFT ARM  Final   Special Requests BOTTLES DRAWN AEROBIC ONLY  1CC  Final   Culture  Setup Time   Final    GRAM NEGATIVE RODS AEROBIC BOTTLE ONLY CRITICAL RESULT CALLED TO, READ BACK BY AND VERIFIED WITH: RN SUPERVISOR ANN THOMPSON AT 1700 02/10/15.Marland KitchenMarland KitchenSMG  Culture   Final    GRAM NEGATIVE RODS IDENTIFICATION AND SUSCEPTIBILITIES TO FOLLOW    Report Status PENDING  Incomplete  Culture, expectorated sputum-assessment     Status: None   Collection Time: 02/06/15  3:40 PM  Result Value Ref Range Status   Specimen Description EXPECTORATED SPUTUM  Final   Special Requests NONE  Final   Sputum evaluation THIS SPECIMEN IS ACCEPTABLE FOR SPUTUM CULTURE  Final   Report Status 02/06/2015 FINAL  Final  Culture, respiratory (NON-Expectorated)     Status: None   Collection Time: 02/06/15  3:40 PM  Result Value Ref Range Status   Specimen Description EXPECTORATED SPUTUM  Final   Special Requests NONE Reflexed from [id number]  Final   Gram Stain   Final    FEW WBC SEEN MANY GRAM NEGATIVE RODS GOOD SPECIMEN - 80-90% WBCS    Culture HEAVY GROWTH KLEBSIELLA PNEUMONIAE  Final   Report Status 02/08/2015 FINAL  Final   Organism ID, Bacteria KLEBSIELLA PNEUMONIAE  Final      Susceptibility   Klebsiella pneumoniae - MIC*    AMPICILLIN >=32 RESISTANT Resistant     CEFAZOLIN >=64 RESISTANT Resistant     CEFTRIAXONE <=1 SENSITIVE Sensitive     CIPROFLOXACIN <=0.25 SENSITIVE Sensitive     GENTAMICIN <=1 SENSITIVE Sensitive     IMIPENEM <=0.25 SENSITIVE Sensitive     NITROFURANTOIN 64 INTERMEDIATE Intermediate     TRIMETH/SULFA <=20 SENSITIVE Sensitive     PIP/TAZO Value in next row Resistant      RESISTANT>=128    * HEAVY GROWTH KLEBSIELLA PNEUMONIAE        Data Review   CBC w Diff:   Lab Results  Component Value Date   WBC 20.8* 02/04/2015   WBC 8.5 11/04/2014   HGB 8.5* 01/27/2015   HGB 11.8* 11/04/2014   HCT 25.9* 01/21/2015   HCT 36.4 11/04/2014   PLT 123* 01/30/2015   PLT 280 11/04/2014   LYMPHOPCT 3* 02/11/2015   LYMPHOPCT 8.2 10/30/2014   BANDSPCT 4 02/01/2015   MONOPCT 6 01/27/2015   MONOPCT 4.9 10/30/2014   EOSPCT 0 02/09/2015   EOSPCT 0.4 10/30/2014   BASOPCT 0 01/17/2015   BASOPCT 1.0 10/30/2014    CMP:  Lab Results  Component Value Date   NA 128* 02/13/2015   NA 135 11/04/2014   K 4.7 02/01/2015   K 3.8 11/04/2014   CL 89* 01/31/2015   CL 100* 11/04/2014   CO2 24 01/28/2015   CO2 28 11/04/2014   BUN 64* 01/31/2015   BUN 20 11/04/2014   CREATININE 4.65* 02/12/2015   CREATININE 1.09* 11/04/2014   PROT 4.3* 01/29/2015   PROT 8.1 10/30/2014   ALBUMIN 1.9* 01/18/2015   ALBUMIN 4.1 10/30/2014   BILITOT 2.5* 02/01/2015   BILITOT 0.4 10/30/2014   ALKPHOS 111 01/31/2015   ALKPHOS 89 10/30/2014   AST 1314* 02/06/2015   AST 31 10/30/2014   ALT 1264* 02/14/2015   ALT 20 10/30/2014  .   Total Time in preparing paper work, data evaluation and todays exam - 35 minutes  Glenden Rossell M.D on 02/13/2015 at 1:08 PM

## 2015-02-16 DEATH — deceased

## 2015-04-18 NOTE — Progress Notes (Signed)
Entered chart for [employer] QI data 

## 2016-08-09 IMAGING — CR DG CHEST 1V
1 series · 1 of 1 positions shown · non-contrast
Comparison: Portable exam 4878 hours compared to 10/10/2012

CLINICAL DATA: Shortness of breath, near syncope

EXAM:
CHEST  1 VIEW

[ap]
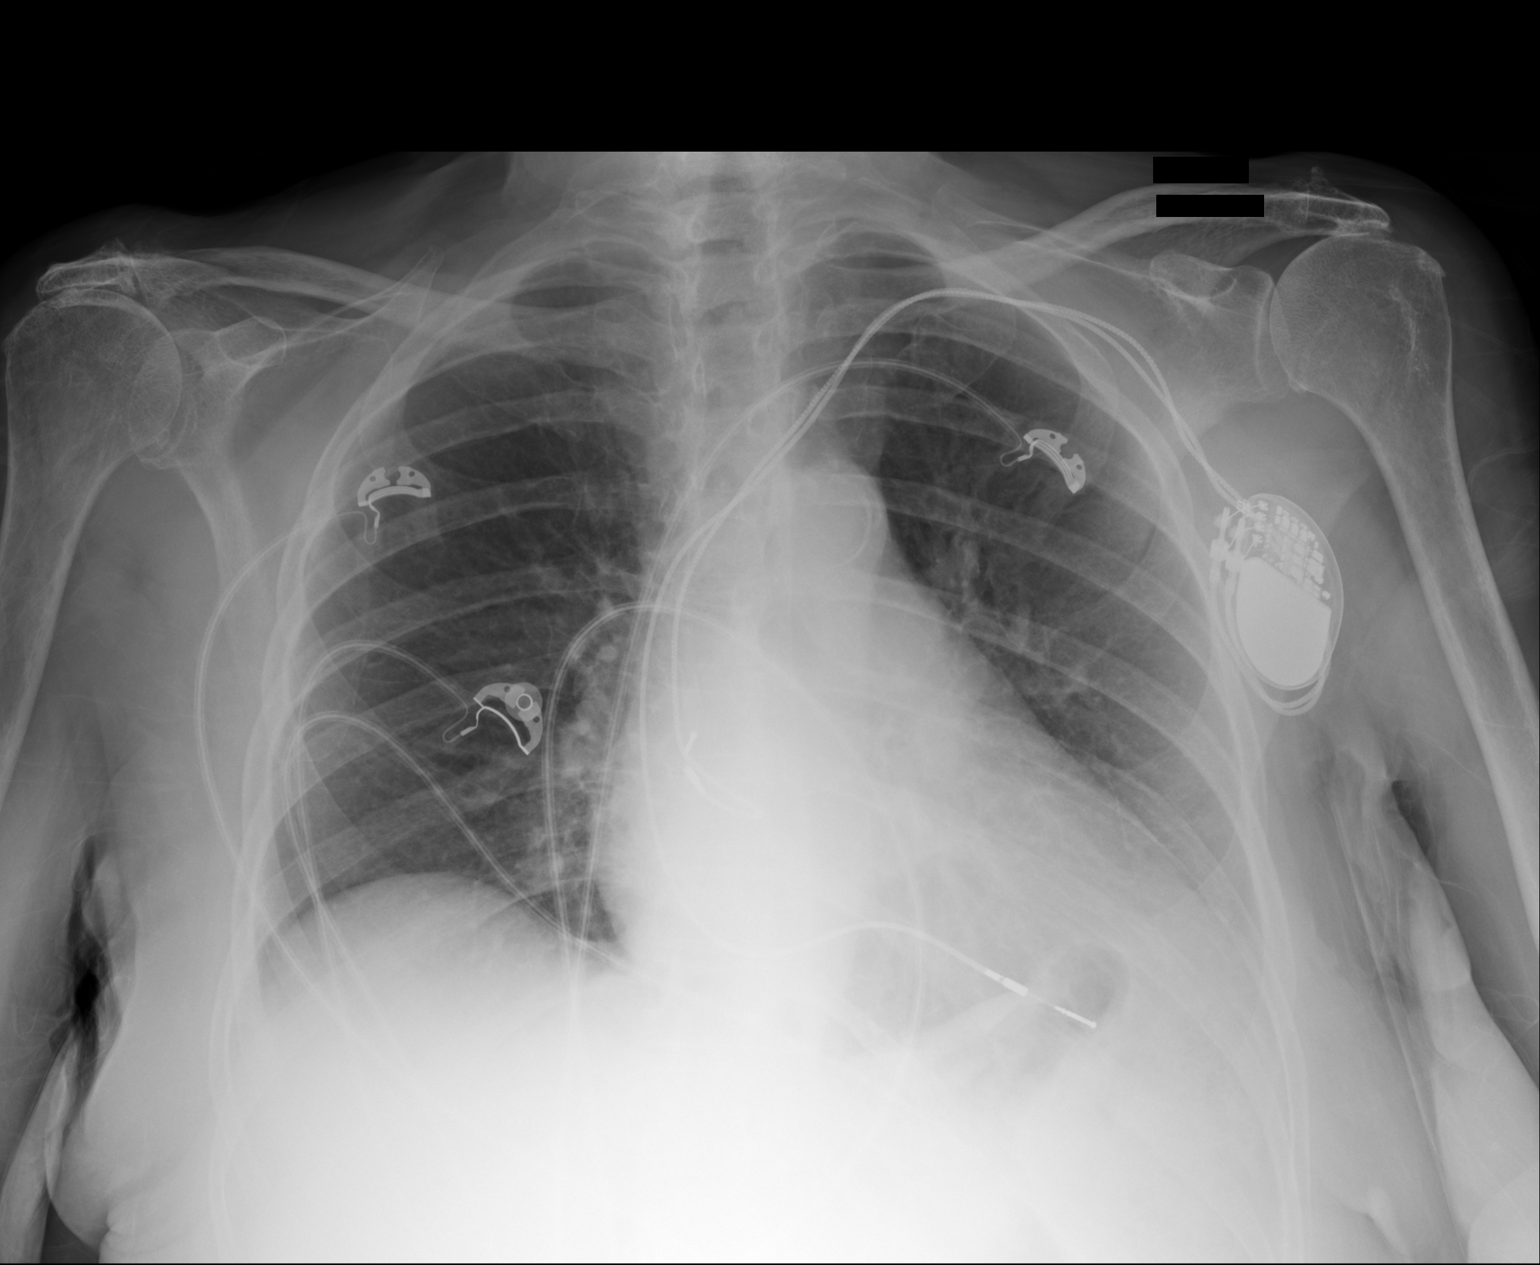

[1 of 1 positions shown; findings below may reference images not displayed]

FINDINGS: LEFT subclavian transvenous pacemaker with leads projecting over
RIGHT atrium and RIGHT ventricle, unchanged.

Borderline enlargement of cardiac silhouette.

Atherosclerotic calcification aorta.

Mediastinal contours and pulmonary vascularity normal.

Lungs clear.

No pleural effusion or pneumothorax.

Bones demineralized with probable BILATERAL chronic rotator cuff
tears.

Surgical clips in the RIGHT cervical region.
IMPRESSION: No acute abnormalities.

## 2016-08-10 IMAGING — CR DG ABDOMEN 1V
1 series · 1 of 1 positions shown · non-contrast
Comparison: 09/20/2014

CLINICAL DATA: OG tube placement.

EXAM:
ABDOMEN - 1 VIEW

[ap]
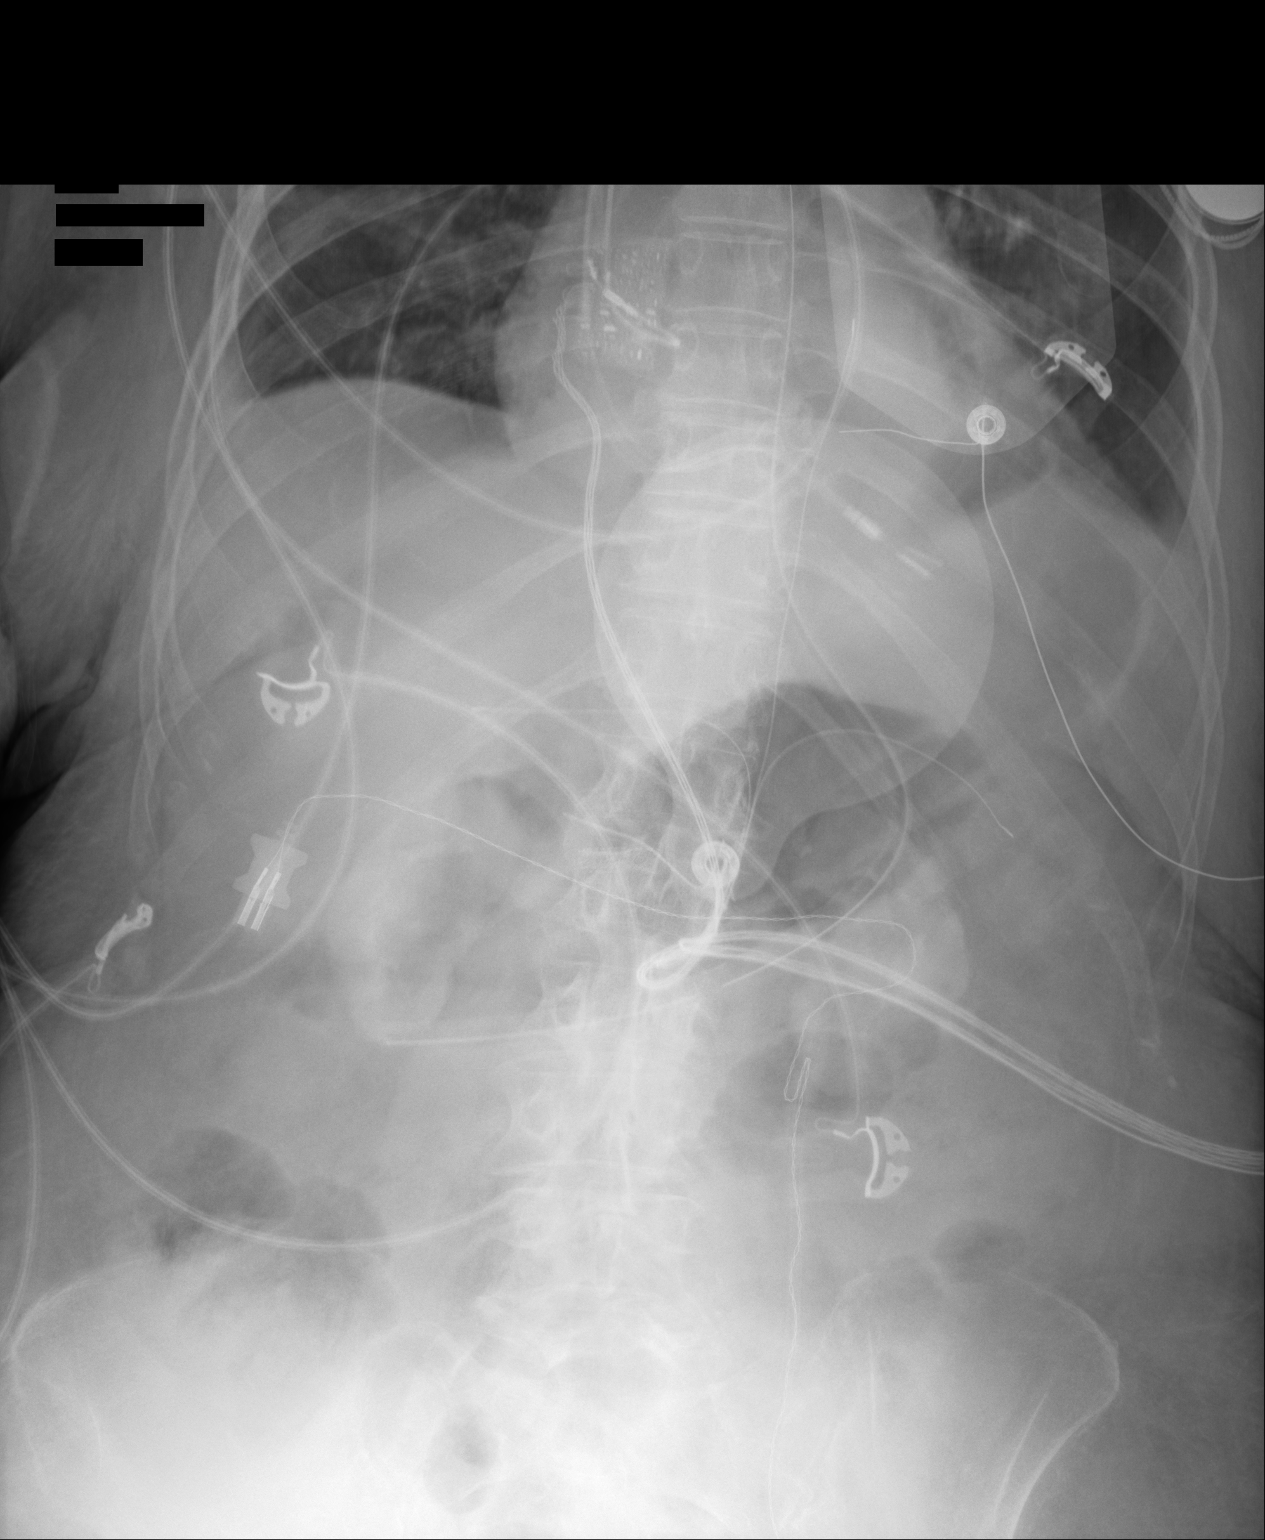

[1 of 1 positions shown; findings below may reference images not displayed]

FINDINGS: The OG tube tip is in the distal stomach.

Normal bowel gas pattern.  No osseous abnormality.
IMPRESSION: OG tube tip in the distal stomach.

## 2016-08-12 IMAGING — CR DG CHEST 1V
1 series · 1 of 1 positions shown · non-contrast
Comparison: 02/04/2015.  CT 09/19/2014 .

CLINICAL DATA: Shortness of breath.  Intubation.

EXAM:
CHEST  1 VIEW

[portable]
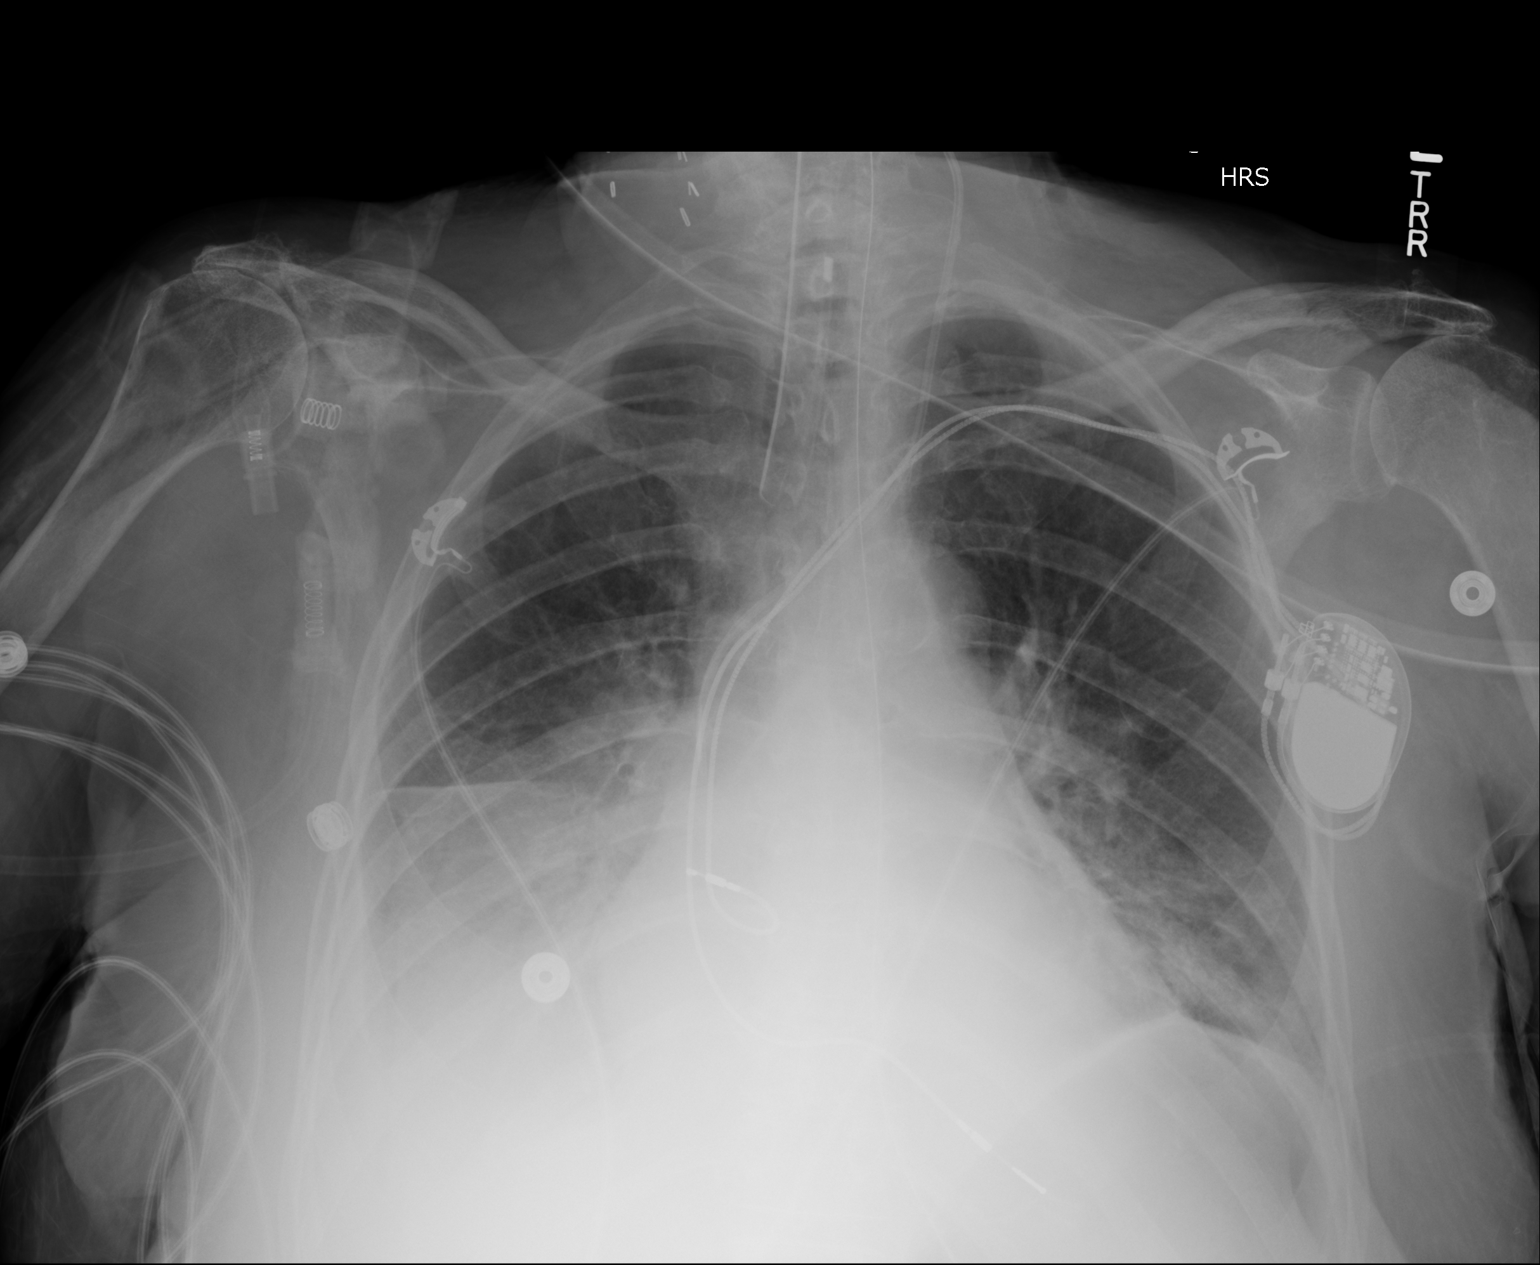

[1 of 1 positions shown; findings below may reference images not displayed]

FINDINGS: Surgical clips are in the right neck. Endotracheal tube, NG tube,
left IJ line in stable position. Cardiac pacer with lead tips in
right atrium right ventricle. Heart size and pulmonary vascularity
normal. Progressive bibasilar atelectasis and/or infiltrates. Small
right pleural effusion. No pneumothorax.
IMPRESSION: 1. Lines and tubes in stable position.
2. Low lung volumes with progressive bibasilar atelectasis and/or
infiltrates. Small right pleural effusion.
